# Patient Record
Sex: Female | Born: 1955 | Race: Black or African American | Hispanic: No | Marital: Married | Smoking: Current every day smoker
Health system: Southern US, Community
[De-identification: ages and names within clinical notes are randomized; demographics above are authoritative.]

## PROBLEM LIST (undated history)

## (undated) DIAGNOSIS — I7122 Aneurysm of the aortic arch, without rupture: Secondary | ICD-10-CM

## (undated) DIAGNOSIS — I10 Essential (primary) hypertension: Secondary | ICD-10-CM

## (undated) DIAGNOSIS — I639 Cerebral infarction, unspecified: Secondary | ICD-10-CM

## (undated) HISTORY — PX: ABDOMINAL HYSTERECTOMY: SHX81

---

## 2015-09-13 ENCOUNTER — Emergency Department
Admission: EM | Admit: 2015-09-13 | Discharge: 2015-09-13 | Disposition: A | Discharge status: Home / Self Care | Payer: Worker's Compensation | Attending: Student | Admitting: Student

## 2015-09-13 ENCOUNTER — Encounter: Payer: Self-pay | Admitting: Emergency Medicine

## 2015-09-13 ENCOUNTER — Emergency Department: Payer: Worker's Compensation

## 2015-09-13 DIAGNOSIS — Y990 Civilian activity done for income or pay: Secondary | ICD-10-CM | POA: Insufficient documentation

## 2015-09-13 DIAGNOSIS — S83412A Sprain of medial collateral ligament of left knee, initial encounter: Secondary | ICD-10-CM

## 2015-09-13 DIAGNOSIS — Y9289 Other specified places as the place of occurrence of the external cause: Secondary | ICD-10-CM | POA: Insufficient documentation

## 2015-09-13 DIAGNOSIS — I10 Essential (primary) hypertension: Secondary | ICD-10-CM | POA: Insufficient documentation

## 2015-09-13 DIAGNOSIS — W1839XA Other fall on same level, initial encounter: Secondary | ICD-10-CM | POA: Insufficient documentation

## 2015-09-13 DIAGNOSIS — Y9389 Activity, other specified: Secondary | ICD-10-CM | POA: Insufficient documentation

## 2015-09-13 DIAGNOSIS — S86912A Strain of unspecified muscle(s) and tendon(s) at lower leg level, left leg, initial encounter: Secondary | ICD-10-CM

## 2015-09-13 HISTORY — DX: Essential (primary) hypertension: I10

## 2015-09-13 MED ORDER — NAPROXEN 500 MG PO TBEC: 500.0000 mg | DELAYED_RELEASE_TABLET | Freq: Two times a day (BID) | ORAL | Status: DC

## 2015-09-13 MED ORDER — HYDROCODONE-ACETAMINOPHEN 5-325 MG PO TABS: 1.0000 | ORAL_TABLET | Freq: Three times a day (TID) | ORAL | Status: DC | PRN

## 2015-09-13 MED ORDER — HYDROCODONE-ACETAMINOPHEN 5-325 MG PO TABS
1.0000 | ORAL_TABLET | Freq: Once | ORAL | Status: AC
Start: 2015-09-13 — End: 2015-09-13
  Administered 2015-09-13: 1 via ORAL
  Filled 2015-09-13: qty 1

## 2015-09-13 NOTE — ED Notes (Signed)
No WC profile on file, pt's employer called, Briana Wright. No answer, will try to call again. WC UDS not done at this time.

## 2015-09-13 NOTE — Discharge Instructions (Signed)
Knee Bracing  Knee braces are supports to help stabilize and protect an injured or painful knee. They come in many different styles. They should support and protect the knee without increasing the chance of other injuries to yourself or others. It is important not to have a false sense of security when using a brace. Knee braces that help you to keep using your knee:  · Do not restore normal knee stability under high stress forces.  · May decrease some aspects of athletic performance.  Some of the different types of knee braces are:  · Prophylactic knee braces are designed to prevent or reduce the severity of knee injuries during sports that make injury to the knee more likely.  · Rehabilitative knee braces are designed to allow protected motion of:  ¨ Injured knees.  ¨ Knees that have been treated with or without surgery.  There is no evidence that the use of a supportive knee brace protects the graft following a successful anterior cruciate ligament (ACL) reconstruction. However, braces are sometimes used to:   · Protect injured ligaments.  · Control knee movement during the initial healing period.  They may be used as part of the treatment program for the various injured ligaments or cartilage of the knee including the:  · Anterior cruciate ligament.  · Medial collateral ligament.  · Medial or lateral cartilage (meniscus).  · Posterior cruciate ligament.  · Lateral collateral ligament.  Rehabilitative knee braces are most commonly used:  · During crutch-assisted walking right after injury.  · During crutch-assisted walking right after surgery to repair the cartilage and/or cruciate ligament injury.  · For a short period of time, 2-8 weeks, after the injury or surgery.  The value of a rehabilitative brace as opposed to a cast or splint includes the:  · Ability to adjust the brace for swelling.  · Ability to remove the brace for examinations, icing, or showering.  · Ability to allow for movement in a controlled  range of motion.  Functional knee braces give support to knees that have already been injured. They are designed to provide stability for the injured knee and provide protection after repair. Functional knee braces may not affect performance much. Lower extremity muscle strengthening, flexibility, and improvement in technique are more important than bracing in treating ligamentous knee injuries. Functional braces are not a substitute for rehabilitation or surgical procedures.  Unloader/off-loader braces are designed to provide pain relief in arthritic knees. Patients with wear and tear arthritis from growing old or from an old cartilage injury (osteoarthritis) of the knee, and bowlegged (varus) or knock-knee (valgus) deformities, often develop increased pain in the arthritic side due to increased loading. Unloader/off-loader braces are made to reduce uneven loading in such knees. There is reduction in bowing out movement in bowlegged knees when the correct unloader brace is used. Patients with advanced osteoarthritis or severe varus or valgus alignment problems would not likely benefit from bracing.  Patellofemoral braces help the kneecap to move smoothly and well centered over the end of the femur in the knee.   Most people who wear knee braces feel that they help. However, there is a lack of scientific evidence that knee braces are helpful at the level needed for athletic participation to prevent injury. In spite of this, athletes report an increase in knee stability, pain relief, performance improvement, and confidence during athletics when using a brace.   Different knee problems require different knee braces:  · Your caregiver may suggest one   also need one for pain in the front of your knee that is not getting better with strengthening and  flexibility exercises. Get your caregiver's advice if you want to try a knee brace. The caregiver will advise you on where to get them and provide a prescription when it is needed to fashion and/or fit the brace. Knee braces are the least important part of preventing knee injuries or getting better following injury. Stretching, strengthening and technique improvement are far more important in caring for and preventing knee injuries. When strengthening your knee, increase your activities a little at a time so as not to develop injuries from overuse. Work out an exercise plan with your caregiver and/or physical therapist to get the best program for you. Do not let a knee brace become a crutch. Always remember, there are no braces which support the knee as well as your original ligaments and cartilage you were born with. Conditioning, proper warm-up, and stretching remain the most important parts of keeping your knees healthy. HOW TO USE A KNEE BRACE  During sports, knee braces should be used as directed by your caregiver.  Make sure that the hinges are where the knee bends.  Straps, tapes, or hook-and-loop tapes should be fastened around your leg as instructed.  You should check the placement of the brace during activities to make sure that it has not moved. Poorly positioned braces can hurt rather than help you.  To work well, a knee brace should be worn during all activities that put you at risk of knee injury.  Warm up properly before beginning athletic activities. HOME CARE INSTRUCTIONS  Knee braces often get damaged during normal use. Replace worn-out braces for maximum benefit.  Clean regularly with soap and water.  Inspect your brace often for wear and tear.  Cover exposed metal to protect others from injury.  Durable materials may cost more, but last longer. SEEK IMMEDIATE MEDICAL CARE IF:   Your knee seems to be getting worse rather than better.  You have increasing pain or  swelling in the knee.  You have problems caused by the knee brace.  You have increased swelling or inflammation (redness or soreness) in your knee.  Your knee becomes warm and more painful and you develop an unexplained temperature over 101F (38.3C). MAKE SURE YOU:   Understand these instructions.  Will watch your condition.  Will get help right away if you are not doing well or get worse. See your caregiver, physical therapist, or orthopedic surgeon for additional information. Document Released: 03/01/2004 Document Revised: 04/26/2014 Document Reviewed: 06/08/2009 [facility] Patient Information 2015 [place], [place]. This information is not intended to replace advice given to you by your health care provider. Make sure you discuss any questions you have with your health care provider.  Knee Sprain A knee sprain is a tear in one of the strong, fibrous tissues that connect the bones (ligaments) in your knee. The severity of the sprain depends on how much of the ligament is torn. The tear can be either partial or complete. CAUSES  Often, sprains are a result of a fall or injury. The force of the impact causes the fibers of your ligament to stretch too much. This excess tension causes the fibers of your ligament to tear. SIGNS AND SYMPTOMS  You may have some loss of motion in your knee. Other symptoms include:  Bruising.  Pain in the knee area.  Tenderness of the knee to the touch.  Swelling. DIAGNOSIS  To diagnose  a knee sprain, your health care provider will physically examine your knee. Your health care provider may also suggest an X-ray exam of your knee to make sure no bones are broken. TREATMENT  If your ligament is only partially torn, treatment usually involves keeping the knee in a fixed position (immobilization) or bracing your knee for activities that require movement for several weeks. To do this, your health care provider will apply a bandage, cast, or splint to keep your  knee from moving and to support your knee during movement until it heals. For a partially torn ligament, the healing process usually takes 4-6 weeks. If your ligament is completely torn, depending on which ligament it is, you may need surgery to reconnect the ligament to the bone or reconstruct it. After surgery, a cast or splint may be applied and will need to stay on your knee for 4-6 weeks while your ligament heals. HOME CARE INSTRUCTIONS  Keep your injured knee elevated to decrease swelling.  To ease pain and swelling, apply ice to the injured area:  Put ice in a plastic bag.  Place a towel between your skin and the bag.  Leave the ice on for 20 minutes, 2-3 times a day.  Only take medicine for pain as directed by your health care provider.  Do not leave your knee unprotected until pain and stiffness go away (usually 4-6 weeks).  If you have a cast or splint, do not allow it to get wet. If you have been instructed not to remove it, cover it with a plastic bag when you shower or bathe. Do not swim.  Your health care provider may suggest exercises for you to do during your recovery to prevent or limit permanent weakness and stiffness. SEEK IMMEDIATE MEDICAL CARE IF:  Your cast or splint becomes damaged.  Your pain becomes worse.  You have significant pain, swelling, or numbness below the cast or splint. MAKE SURE YOU:  Understand these instructions.  Will watch your condition.  Will get help right away if you are not doing well or get worse. Document Released: 12/10/2005 Document Revised: 09/30/2013 Document Reviewed: 07/22/2013 [facility] Patient Information 2015 [place], [place]. This information is not intended to replace advice given to you by your health care provider. Make sure you discuss any questions you have with your health care provider.  Your exam and x-ray indicate a strain to the knee.  You should wear the splint, and use the crutches to walk.  You should follow-up  with Dr. Hyacinth Meeker or your company's ortho provider for further management. Apply ice to reduce symptoms. Take the prescription meds as directed.

## 2015-09-13 NOTE — ED Provider Notes (Signed)
[facility] Emergency Department Provider Note ____________________________________________  Time seen: 1500  I have reviewed the triage vital signs and the nursing notes.  HISTORY  Chief Complaint  Knee Pain  HPI Briana Wright is a 59 y.o. female patient to the ED for evaluation of injury to her left knee while at work. 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 She noted immediate pain to the knee and swelling as well. She is here for evaluation of her pain and disability. She rates her pain at a 9/10 in triage.  Past Medical History  Diagnosis Date  . Hypertension    There are no active problems to display for this patient.   Past Surgical History  Procedure Laterality Date  . Abdominal hysterectomy     Current Outpatient Rx  Name  Route  Sig  Dispense  Refill  . HYDROcodone-acetaminophen (NORCO) 5-325 MG per tablet   Oral   Take 1 tablet by mouth every 8 (eight) hours as needed for moderate pain.   12 tablet   0   . naproxen (EC NAPROSYN) 500 MG EC tablet   Oral   Take 1 tablet (500 mg total) by mouth 2 (two) times daily with a meal.   30 tablet   0    Allergies Review of patient's allergies indicates no known allergies.  No family history on file.  Social History Social History  Substance Use Topics  . Smoking status: Never Smoker   . Smokeless tobacco: None  . Alcohol Use: No    Review of Systems  Constitutional: Negative for fever. Eyes: Negative for visual changes. ENT: Negative for sore throat. Cardiovascular: Negative for chest pain. Respiratory: Negative for shortness of breath. Gastrointestinal: Negative for abdominal pain, vomiting and diarrhea. Genitourinary: Negative for dysuria. Musculoskeletal: Negative for back pain. Left knee pain Skin: Negative for rash. Neurological: Negative  for headaches, focal weakness or numbness. ____________________________________________  PHYSICAL EXAM:  VITAL SIGNS: ED Triage Vitals  Enc Vitals Group     BP 09/13/15 1327 225/110 mmHg     Pulse Rate 09/13/15 1327 89     Resp 09/13/15 1327 22     Temp 09/13/15 1327 98 F (36.7 C)     Temp Source 09/13/15 1327 Oral     SpO2 09/13/15 1327 95 %     Weight 09/13/15 1327 160 lb (72.576 kg)     Height 09/13/15 1327  (1.651 m)     Head Cir --      Peak Flow --      Pain Score 09/13/15 1333 9     Pain Loc --      Pain Edu? --      Excl. in GC? --    Constitutional: Alert and oriented. Well appearing and in no distress. Eyes: Conjunctivae are normal. PERRL. Normal extraocular movements. ENT   Head: Normocephalic and atraumatic.   Nose: No congestion/rhinorrhea.   Mouth/Throat: Mucous membranes are moist.   Neck: Supple. No thyromegaly. Hematological/Lymphatic/Immunological: No cervical lymphadenopathy. Cardiovascular: Normal rate, regular rhythm.  Respiratory: Normal respiratory effort. No wheezes/rales/rhonchi. Gastrointestinal: Soft and nontender. No distention. Musculoskeletal: Left knee without obvious deformity, but mild joint effusion is appreciated predominately with medial joint swelling. She tender to palp over the medial aspect of the femoral condyle. Tender to palp over the medial collateral ligament. Nontender with normal range  of motion in all extremities.  Neurologic:  Normal gait without ataxia. Normal speech and language. No gross focal neurologic deficits are appreciated. Skin:  Skin is warm, dry and intact. No rash noted. Psychiatric: Mood and affect are normal. Patient exhibits appropriate insight and judgment. ____________________________________________   RADIOLOGY Left Knee IMPRESSION: Mild suprapatellar joint effusion. No fracture or dislocation is Noted.  I, Legacy Carrender, Charlesetta Ivory, personally viewed and evaluated these images (plain  radiographs) as part of my medical decision making.  ____________________________________________  PROCEDURES  Ace bandage Knee immobilizer Norco 5-325 mg PO ____________________________________________  INITIAL IMPRESSION / ASSESSMENT AND PLAN / ED COURSE  Left knee strain with mild effusion, consistent consistent with a MCL strain. We'll immobilize the knee and provide patient with appropriate orthopedics follow-up. She is to return to work with activities limited by splint and crutches. ____________________________________________  FINAL CLINICAL IMPRESSION(S) / ED DIAGNOSES  Final diagnoses:  Knee strain, left, initial encounter  Knee MCL sprain, left, initial encounter      Lissa Hoard, PA-C 09/13/15 1553  Gayla Doss, MD 09/13/15 1600

## 2015-09-13 NOTE — ED Notes (Signed)
Pt here from [employer], WC. Pt reports client fell on her, reports left knee pain.

## 2015-09-13 NOTE — ED Notes (Signed)
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  No deformity.

## 2015-10-07 ENCOUNTER — Observation Stay
Admission: EM | Admit: 2015-10-07 | Discharge: 2015-10-08 | Disposition: A | Discharge status: Home / Self Care | Payer: Self-pay | Attending: Internal Medicine | Admitting: Internal Medicine

## 2015-10-07 ENCOUNTER — Emergency Department: Payer: Self-pay

## 2015-10-07 ENCOUNTER — Other Ambulatory Visit: Payer: Self-pay

## 2015-10-07 DIAGNOSIS — I517 Cardiomegaly: Secondary | ICD-10-CM | POA: Insufficient documentation

## 2015-10-07 DIAGNOSIS — Z90710 Acquired absence of both cervix and uterus: Secondary | ICD-10-CM | POA: Insufficient documentation

## 2015-10-07 DIAGNOSIS — F17210 Nicotine dependence, cigarettes, uncomplicated: Secondary | ICD-10-CM | POA: Insufficient documentation

## 2015-10-07 DIAGNOSIS — Z8249 Family history of ischemic heart disease and other diseases of the circulatory system: Secondary | ICD-10-CM | POA: Insufficient documentation

## 2015-10-07 DIAGNOSIS — R0789 Other chest pain: Principal | ICD-10-CM | POA: Insufficient documentation

## 2015-10-07 DIAGNOSIS — R000 Tachycardia, unspecified: Secondary | ICD-10-CM | POA: Insufficient documentation

## 2015-10-07 DIAGNOSIS — R079 Chest pain, unspecified: Secondary | ICD-10-CM | POA: Diagnosis present

## 2015-10-07 DIAGNOSIS — I159 Secondary hypertension, unspecified: Secondary | ICD-10-CM | POA: Insufficient documentation

## 2015-10-07 DIAGNOSIS — J9811 Atelectasis: Secondary | ICD-10-CM | POA: Insufficient documentation

## 2015-10-07 DIAGNOSIS — R918 Other nonspecific abnormal finding of lung field: Secondary | ICD-10-CM | POA: Insufficient documentation

## 2015-10-07 DIAGNOSIS — F419 Anxiety disorder, unspecified: Secondary | ICD-10-CM | POA: Insufficient documentation

## 2015-10-07 DIAGNOSIS — I4581 Long QT syndrome: Secondary | ICD-10-CM | POA: Insufficient documentation

## 2015-10-07 DIAGNOSIS — R0602 Shortness of breath: Secondary | ICD-10-CM | POA: Insufficient documentation

## 2015-10-07 LAB — BASIC METABOLIC PANEL
ANION GAP: 10 (ref 5–15)
BUN: 29 mg/dL — AB (ref 6–20)
CALCIUM: 9.6 mg/dL (ref 8.9–10.3)
CO2: 24 mmol/L (ref 22–32)
CREATININE: 0.73 mg/dL (ref 0.44–1.00)
Chloride: 106 mmol/L (ref 101–111)
GFR calc Af Amer: >60 mL/min (ref >60)
GFR calc non Af Amer: >60 mL/min (ref >60)
GLUCOSE: 112 mg/dL — AB (ref 65–99)
Potassium: 3.8 mmol/L (ref 3.5–5.1)
Sodium: 140 mmol/L (ref 135–145)

## 2015-10-07 LAB — CBC
HCT: 43.6 % (ref 35.0–47.0)
HEMOGLOBIN: 14.3 g/dL (ref 12.0–16.0)
MCH: 27.6 pg (ref 26.0–34.0)
MCHC: 32.7 g/dL (ref 32.0–36.0)
MCV: 84.4 fL (ref 80.0–100.0)
Platelets: 300 10*3/uL (ref 150–440)
RBC: 5.16 MIL/uL (ref 3.80–5.20)
RDW: 14.5 % (ref 11.5–14.5)
WBC: 11.8 10*3/uL — ABNORMAL HIGH (ref 3.6–11.0)

## 2015-10-07 LAB — BRAIN NATRIURETIC PEPTIDE: B Natriuretic Peptide: 27 pg/mL (ref 0.0–100.0)

## 2015-10-07 LAB — FIBRIN DERIVATIVES D-DIMER (ARMC ONLY): Fibrin derivatives D-dimer (ARMC): 374.7 (ref 0–499)

## 2015-10-07 LAB — TROPONIN I: TROPONIN I: 0.03 ng/mL (ref <0.031)

## 2015-10-07 LAB — CK: CK TOTAL: 83 U/L (ref 38–234)

## 2015-10-07 MED ORDER — ATORVASTATIN CALCIUM 10 MG PO TABS
10.0000 mg | ORAL_TABLET | Freq: Every day | ORAL | Status: DC
  Administered 2015-10-07: 10 mg via ORAL
  Filled 2015-10-07: qty 1

## 2015-10-07 MED ORDER — ONDANSETRON HCL 4 MG/2ML IJ SOLN
4.0000 mg | Freq: Once | INTRAMUSCULAR | Status: AC
  Administered 2015-10-07: 4 mg via INTRAVENOUS

## 2015-10-07 MED ORDER — ACETAMINOPHEN 325 MG PO TABS
650.0000 mg | ORAL_TABLET | Freq: Four times a day (QID) | ORAL | Status: DC | PRN
  Administered 2015-10-07: 650 mg via ORAL
  Filled 2015-10-07: qty 2

## 2015-10-07 MED ORDER — DOCUSATE SODIUM 100 MG PO CAPS
100.0000 mg | ORAL_CAPSULE | Freq: Two times a day (BID) | ORAL | Status: DC
  Administered 2015-10-07: 100 mg via ORAL
  Filled 2015-10-07: qty 1

## 2015-10-07 MED ORDER — MORPHINE SULFATE (PF) 2 MG/ML IV SOLN: 1.0000 mg | INTRAVENOUS | Status: DC | PRN

## 2015-10-07 MED ORDER — MORPHINE SULFATE (PF) 4 MG/ML IV SOLN
INTRAVENOUS | Status: AC
  Administered 2015-10-07: 4 mg via INTRAMUSCULAR
  Filled 2015-10-07: qty 1

## 2015-10-07 MED ORDER — ENOXAPARIN SODIUM 40 MG/0.4ML ~~LOC~~ SOLN
40.0000 mg | SUBCUTANEOUS | Status: DC
  Filled 2015-10-07: qty 0.4

## 2015-10-07 MED ORDER — MORPHINE SULFATE (PF) 4 MG/ML IV SOLN
4.0000 mg | Freq: Once | INTRAVENOUS | Status: AC
  Administered 2015-10-07: 4 mg via INTRAVENOUS
  Filled 2015-10-07: qty 1

## 2015-10-07 MED ORDER — HYDRALAZINE HCL 20 MG/ML IJ SOLN
10.0000 mg | Freq: Once | INTRAMUSCULAR | Status: AC
  Administered 2015-10-07: 10 mg via INTRAVENOUS
  Filled 2015-10-07: qty 1

## 2015-10-07 MED ORDER — ACETAMINOPHEN 650 MG RE SUPP: 650.0000 mg | Freq: Four times a day (QID) | RECTAL | Status: DC | PRN

## 2015-10-07 MED ORDER — MORPHINE SULFATE (PF) 4 MG/ML IV SOLN
4.0000 mg | Freq: Once | INTRAVENOUS | Status: AC
  Administered 2015-10-07: 4 mg via INTRAMUSCULAR

## 2015-10-07 MED ORDER — SODIUM CHLORIDE 0.9 % IJ SOLN
3.0000 mL | Freq: Two times a day (BID) | INTRAMUSCULAR | Status: DC
  Administered 2015-10-07 (×2): 3 mL via INTRAVENOUS

## 2015-10-07 MED ORDER — HYDRALAZINE HCL 20 MG/ML IJ SOLN
10.0000 mg | Freq: Four times a day (QID) | INTRAMUSCULAR | Status: DC | PRN
  Administered 2015-10-07: 10 mg via INTRAVENOUS

## 2015-10-07 MED ORDER — HYDRALAZINE HCL 25 MG PO TABS
25.0000 mg | ORAL_TABLET | Freq: Three times a day (TID) | ORAL | Status: DC
  Administered 2015-10-07 – 2015-10-08 (×2): 25 mg via ORAL
  Filled 2015-10-07 (×3): qty 1

## 2015-10-07 MED ORDER — HYDRALAZINE HCL 20 MG/ML IJ SOLN
INTRAMUSCULAR | Status: AC
  Administered 2015-10-07: 10 mg via INTRAVENOUS
  Filled 2015-10-07: qty 1

## 2015-10-07 MED ORDER — METOPROLOL TARTRATE 25 MG PO TABS
25.0000 mg | ORAL_TABLET | Freq: Two times a day (BID) | ORAL | Status: DC
  Administered 2015-10-07 – 2015-10-08 (×3): 25 mg via ORAL
  Filled 2015-10-07 (×3): qty 1

## 2015-10-07 MED ORDER — ASPIRIN 81 MG PO CHEW
324.0000 mg | CHEWABLE_TABLET | Freq: Once | ORAL | Status: AC
  Administered 2015-10-07: 324 mg via ORAL
  Filled 2015-10-07: qty 4

## 2015-10-07 MED ORDER — ONDANSETRON 4 MG PO TBDP
4.0000 mg | ORAL_TABLET | Freq: Once | ORAL | Status: AC
  Administered 2015-10-07: 4 mg via ORAL

## 2015-10-07 MED ORDER — SENNOSIDES-DOCUSATE SODIUM 8.6-50 MG PO TABS
1.0000 | ORAL_TABLET | Freq: Every evening | ORAL | Status: DC | PRN
  Administered 2015-10-07: 1 via ORAL
  Filled 2015-10-07: qty 1

## 2015-10-07 MED ORDER — ONDANSETRON 4 MG PO TBDP
ORAL_TABLET | ORAL | Status: AC
  Administered 2015-10-07: 4 mg via ORAL
  Filled 2015-10-07: qty 1

## 2015-10-07 MED ORDER — ONDANSETRON HCL 4 MG/2ML IJ SOLN
INTRAMUSCULAR | Status: AC
  Administered 2015-10-07: 4 mg via INTRAVENOUS
  Filled 2015-10-07: qty 2

## 2015-10-07 MED ORDER — ASPIRIN EC 325 MG PO TBEC
325.0000 mg | DELAYED_RELEASE_TABLET | Freq: Every day | ORAL | Status: DC
  Administered 2015-10-08: 325 mg via ORAL
  Filled 2015-10-07: qty 1

## 2015-10-07 NOTE — ED Notes (Signed)
Pt was very frustrated when she got to the room because she has already been stuck for blood twice,   She reports that she is a difficult stick and she stated that she does not want to have to go through that again.   After talking and reasoning with pt, she allowed RN to attempt venipuncture with butterfly once.   She said that she did not want an IV unless she is sure she needs it.

## 2015-10-07 NOTE — Progress Notes (Signed)
Patient alert and oriented x4. Oriented to room, unit, and call bell. Admission completed. No complaints at this time. Will cont to assess. Skin assessment verified by Jessica Christmas, RN. Telemetry box verified. Briana Wright   

## 2015-10-07 NOTE — ED Notes (Signed)
Patient to ED for chest pain. States it woke her up last night. States for a "while now" I can't "walk outside my house to the mailbox and I don't get winded." Had a fall on 09/20 that she thinks may be contributing to her decline in health. Points to areas of "brusing" all over her body. No ecchymosis noted to stated body parts. Patient alert and oriented, able to answer questions appropriately.

## 2015-10-07 NOTE — ED Provider Notes (Signed)
[facility] Emergency Department Provider Note  ____________________________________________  Time seen: Approximately 5:12 AM  I have reviewed the triage vital signs and the nursing notes.   HISTORY  Chief Complaint Chest Pain    HPI Briana Wright is a 59 y.o. female who presents to the ED from home with a chief complaint of chest pain. Patient complains of intermittent left-sided chest pain 4 days. Describes aching, pressure type, nonradiating pain. Awoke with chest pain this morning. Symptoms associated with "cold sweats", nausea and shortness of breath. 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 Denies recent fever, chills, cough, congestion, abdominal pain, vomiting, diarrhea. No recent travel. Nothing makes the pain better or worse. Does note she has had increased dyspnea on exertion this week.   Past Medical History  Diagnosis Date  . Hypertension     There are no active problems to display for this patient.   Past Surgical History  Procedure Laterality Date  . Abdominal hysterectomy      Current Outpatient Rx  Name  Route  Sig  Dispense  Refill  . HYDROcodone-acetaminophen (NORCO) 5-325 MG per tablet   Oral   Take 1 tablet by mouth every 8 (eight) hours as needed for moderate pain.   12 tablet   0   . naproxen (EC NAPROSYN) 500 MG EC tablet   Oral   Take 1 tablet (500 mg total) by mouth 2 (two) times daily with a meal.   30 tablet   0     Allergies Review of patient's allergies indicates no known allergies.  Family history CAD   Social History Social History  Substance Use Topics  . Smoking status: Current Every Day Smoker  . Smokeless tobacco: None  . Alcohol Use: No    Review of Systems Constitutional: No fever/chills Eyes: No visual changes. ENT: No sore  throat. Cardiovascular: Positive for chest pain. Respiratory: Positive for shortness of breath. Gastrointestinal: No abdominal pain.  Positive for nausea, no vomiting.  No diarrhea.  No constipation. Genitourinary: Negative for dysuria. Musculoskeletal: Positive for left knee pain. Negative for back pain. Skin: Negative for rash. Neurological: Negative for headaches, focal weakness or numbness.  10-point ROS otherwise negative.  ____________________________________________   PHYSICAL EXAM:  VITAL SIGNS: ED Triage Vitals  Enc Vitals Group     BP --      Pulse --      Resp --      Temp --      Temp src --      SpO2 --      Weight --      Height --      Head Cir --      Peak Flow --      Pain Score 10/07/15 0410 7     Pain Loc --      Pain Edu? --      Excl. in GC? --     Constitutional: Alert and oriented. Well appearing and in mild acute distress. Appears angry and irritated. Eyes: Conjunctivae are normal. PERRL. EOMI. Head: Atraumatic. Nose: No congestion/rhinnorhea. Mouth/Throat: Mucous membranes are moist.  Oropharynx non-erythematous. Neck: No stridor.   Cardiovascular: Normal rate, regular rhythm. Grossly normal heart sounds.  Good peripheral circulation. No reproducible chest pain. Respiratory: Normal respiratory effort.  No retractions. Lungs CTAB. Gastrointestinal: Soft  and nontender. No distention. No abdominal bruits. No CVA tenderness. Musculoskeletal: Small bruise to the left posterior calf which is tender to palpation. Calf is supple without evidence for compartment syndrome. Neurologic:  Normal speech and language. No gross focal neurologic deficits are appreciated.  Skin:  Skin is warm, dry and intact. No rash noted. Psychiatric: Mood and affect are normal. Speech and behavior are normal.  ____________________________________________   LABS (all labs ordered are listed, but only abnormal results are displayed)  Labs Reviewed  BASIC METABOLIC PANEL   CBC  TROPONIN I  BRAIN NATRIURETIC PEPTIDE  FIBRIN DERIVATIVES D-DIMER ([facility] ONLY)   ____________________________________________  EKG  ED ECG REPORT I, SUNG,JADE J, the attending physician, personally viewed and interpreted this ECG.   Date: 10/07/2015  EKG Time: 0411  Rate: 114  Rhythm: sinus tachycardia  Axis: Normal  Intervals:none  ST&T Change: Inverted T waves inferior laterally No old EKG for comparison ____________________________________________  RADIOLOGY  Chest x-ray (viewed by me, interpreted per Dr. Andria Meuse): Linear scarring or atelectasis in the lung bases. No evidence of focal consolidation.  Doppler ultrasound left leg interpreted per Dr. Andria Meuse: No evidence of DVT. ____________________________________________   PROCEDURES  Procedure(s) performed: None  Critical Care performed: No  ____________________________________________   INITIAL IMPRESSION / ASSESSMENT AND PLAN / ED COURSE  Pertinent labs & imaging results that were available during my care of the patient were reviewed by me and considered in my medical decision making (see chart for details).  59 year old female who presents with left-sided chest pain. She is tachycardic on exam with left calf pain. No prior history of coronary artery disease or congestive heart failure. Patient is a difficult venous stick. Will administer IM morphine for comfort; nurse to place ultrasound-guided IV. Will obtain basic lab work including troponin, d-dimer; obtain Doppler ultrasound of left lower extremity to evaluate for DVT.  ----------------------------------------- 7:05 AM on 10/07/2015 -----------------------------------------  Patient resting in NAD. Blood pressure remains elevated. Will order IV dose of hydralazine. Awaiting laboratory results. Anticipate hospital admission for chest pain. Care transferred to Dr. Fanny Bien. ____________________________________________   FINAL CLINICAL IMPRESSION(S)  / ED DIAGNOSES  Final diagnoses:  Chest pain, unspecified chest pain type      Irean Hong, MD 10/07/15 [id number]

## 2015-10-07 NOTE — ED Notes (Signed)
Pt to xray

## 2015-10-07 NOTE — ED Provider Notes (Signed)
Patient reported a slight recurrence of her chest pain, every ordered morphine 4. Her first and labs are reassuring,    repeat EKG is performed 1121 Interpreted as normal sinus rhythm, ventricular rate 92 LVH Nonspecific T-wave abnormality, as compared with previous EKG from 4:15 AM there is less notable T-wave inversions. Cannot rule out ischemic changes, based upon her ongoing and recurrent chest pain will do the hospital for ongoing chest pain management. No evidence of acute ST elevation MI.    Sharyn CreamerMark Quale, MD 10/07/15 1124

## 2015-10-07 NOTE — H&P (Signed)
[facility] [place] at [facility]   PATIENT NAME: Briana Wright    MR#:  [id number]  DATE OF BIRTH:  06/21/1956  DATE OF ADMISSION:  10/07/2015  PRIMARY CARE PHYSICIAN: Corky DownsMASOUD,JAVED, MD   REQUESTING/REFERRING PHYSICIAN: Dr Fanny BienQuale  CHIEF COMPLAINT:  Chest pain HISTORY OF PRESENT ILLNESS:  Briana Wright  is a 59 y.o. female with a known history of essential hypertension who is not taking medications presents with above complaint. Patient reports since Monday she's had ongoing chest pain. She reports that her chest pain is worse with movement. It also occurs at rest. It has been constant since Monday however she did have some relief taking aspirin, Tylenol and IV Pitocin. She presented today because she continues to have this chest pain. Chest pain is substernally located predominantly. It does not radiate to her arms but occasionally has radiated to her left shoulder. She does report she has shortness of breath associated with her constant chest pain.  PAST MEDICAL HISTORY:   Past Medical History  Diagnosis Date  . Hypertension     PAST SURGICAL HISTORY:   Past Surgical History  Procedure Laterality Date  . Abdominal hysterectomy      SOCIAL HISTORY:   Social History  Substance Use Topics  . Smoking status: Current Every Day Smoker  . Smokeless tobacco: Not on file  . Alcohol Use: No    FAMILY HISTORY:  No family history on file.  DRUG ALLERGIES:  No Known Allergies   REVIEW OF SYSTEMS:  CONSTITUTIONAL: No fever, fatigue or weakness.  EYES: No blurred or double vision.  EARS, NOSE, AND THROAT: No tinnitus or ear pain.  RESPIRATORY: No cough, shortness of breath, wheezing or hemoptysis.  CARDIOVASCULAR:  positive chest pain, no orthopnea, edema.  GASTROINTESTINAL: No nausea, vomiting, diarrhea or abdominal pain.  GENITOURINARY: No dysuria, hematuria.  ENDOCRINE: No polyuria, nocturia,  HEMATOLOGY: No anemia, easy bruising or  bleeding SKIN: No rash or lesion. MUSCULOSKELETAL: No joint pain or arthritis.   NEUROLOGIC: No tingling, numbness, weakness.  PSYCHIATRY: No anxiety or depression.   MEDICATIONS AT HOME:   Prior to Admission medications   Medication Sig Start Date End Date Taking? Authorizing Provider  ibuprofen (ADVIL,MOTRIN) 200 MG tablet Take 400 mg by mouth every 6 (six) hours as needed.   Yes Historical Provider, MD      VITAL SIGNS:  Blood pressure 163/96, pulse 91, resp. rate 18, SpO2 97 %.  PHYSICAL EXAMINATION:  GENERAL:  59 y.o.-year-old patient lying in the bed with no acute distress.  EYES: Pupils equal, round, reactive to light and accommodation. No scleral icterus. Extraocular muscles intact.  HEENT: Head atraumatic, normocephalic. Oropharynx clear.  NECK:  Supple, no jugular venous distention. No thyroid enlargement, no tenderness.  LUNGS: Normal breath sounds bilaterally, no wheezing, rales,rhonchi or crepitation. No use of accessory muscles of respiration.  CARDIOVASCULAR: S1, S2 normal. No murmurs, rubs, or gallops.  ABDOMEN: Soft, nontender, nondistended. Bowel sounds present. No organomegaly or mass.  EXTREMITIES: No pedal edema, cyanosis, or clubbing.  NEUROLOGIC: Cranial nerves II through XII are grossly intact. No focal deficits. PSYCHIATRIC: The patient is alert and oriented x 3.  SKIN: No obvious rash, lesion, or ulcer.   LABORATORY PANEL:   CBC  Recent Labs Lab 10/07/15 0606  WBC 11.8*  HGB 14.3  HCT 43.6  PLT 300   ------------------------------------------------------------------------------------------------------------------  Chemistries   Recent Labs Lab 10/07/15 0606  NA 140  K 3.8  CL 106  CO2 24  GLUCOSE 112*  BUN 29*  CREATININE 0.73  CALCIUM 9.6   ------------------------------------------------------------------------------------------------------------------  Cardiac Enzymes  Recent Labs Lab 10/07/15 0606  TROPONINI <0.03    ------------------------------------------------------------------------------------------------------------------  RADIOLOGY:  Dg Chest 2 View  10/07/2015  CLINICAL DATA:  Chest pain since Monday. Patient gets winded walking to the female box. Bruising all over the body. Smoker. EXAM: CHEST  2 VIEW COMPARISON:  None. FINDINGS: Linear scarring or atelectasis in the lung bases. Peribronchial thickening suggesting chronic bronchitis. Normal heart size and pulmonary vascularity. No focal airspace disease or consolidation in the lungs. No blunting of costophrenic angles. No pneumothorax. Mediastinal contours appear intact. Calcified aorta. IMPRESSION: Linear scarring or atelectasis in the lung bases. No evidence of focal consolidation. Electronically Signed   By: Burman Nieves M.D.   On: 10/07/2015 04:48   US Venous Img Lower Unilateral Left  10/07/2015  CLINICAL DATA:  Calf pain for 1 month. Patient had a client fall on her. EXAM: Left LOWER EXTREMITY VENOUS DOPPLER ULTRASOUND TECHNIQUE: Gray-scale sonography with graded compression, as well as color Doppler and duplex ultrasound were performed to evaluate the lower extremity deep venous systems from the level of the common femoral vein and including the common femoral, femoral, profunda femoral, popliteal and calf veins including the posterior tibial, peroneal and gastrocnemius veins when visible. The superficial great saphenous vein was also interrogated. Spectral Doppler was utilized to evaluate flow at rest and with distal augmentation maneuvers in the common femoral, femoral and popliteal veins. COMPARISON:  None. FINDINGS: Contralateral Common Femoral Vein: Respiratory phasicity is normal and symmetric with the symptomatic side. No evidence of thrombus. Normal compressibility. Common Femoral Vein: No evidence of thrombus. Normal compressibility, respiratory phasicity and response to augmentation. Saphenofemoral Junction: No evidence of thrombus.  Normal compressibility and flow on color Doppler imaging. Profunda Femoral Vein: No evidence of thrombus. Normal compressibility and flow on color Doppler imaging. Femoral Vein: No evidence of thrombus. Normal compressibility, respiratory phasicity and response to augmentation. Popliteal Vein: No evidence of thrombus. Normal compressibility, respiratory phasicity and response to augmentation. Calf Veins: No evidence of thrombus. Normal compressibility and flow on color Doppler imaging. Superficial Great Saphenous Vein: No evidence of thrombus. Normal compressibility and flow on color Doppler imaging. Venous Reflux:  None. Other Findings:  None. IMPRESSION: No evidence of deep venous thrombosis. Electronically Signed   By: Burman Nieves M.D.   On: 10/07/2015 06:47    EKG:  Normal sinus rhythm EKG at 4 clock in the morning shows no ST elevations. EKG at 11:00 with some chest pain had some T-wave changes IMPRESSION AND PLAN:  59 year old female with history of hypertension not taking medications who presents with atypical chest pain.  1. Atypical chest pain: Patient reports this is been constant since Monday. She continues to have chest pain and had an abnormal second EKG, therefore warrants admission into the hospital for cardiac workup. First set of troponin is negative. I have ordered 2 more. If these are all negative patient will undergo cardiac stress test. I will also consult cardiology for further recommendations. Patient has been started on aspirin, metoprolol and statin.  2. Accelerated hypertension: Patient had not been taking medications. She says "I don't like taking pills". She does agree to start metoprolol. Hydralazine when necessary has also been ordered.  3. Tobacco dependence: Patient reports she smokes cigarettes only when she's anxious. Patient was counseled for 3 minutes to completely stop smoking and find another outlet for her anxiety.  All the records are reviewed  and case  discussed with ED provider. Management plans discussed with the patient and she is in agreement.  CODE STATUS: Full  TOTAL TIME TAKING CARE OF THIS PATIENT: 45 minutes.    Cate Oravec M.D on 10/07/2015 at 12:08 PM  Between 7am to 6pm - Pager - [phone] After 6pm go to [email or web address] - password [email or web address]  [place] [facility]  Office  [phone]  CC: Primary care physician; Corky Downs, MD

## 2015-10-07 NOTE — ED Notes (Signed)
Pt to ultrasound via wheelchair.

## 2015-10-07 NOTE — ED Notes (Signed)
Pt went to x-ray from triage,  She has not returned to the room yet

## 2015-10-08 ENCOUNTER — Observation Stay: Payer: Self-pay

## 2015-10-08 LAB — LIPID PANEL
Cholesterol: 258 mg/dL — ABNORMAL HIGH (ref 0–200)
HDL: 36 mg/dL — AB (ref >40)
LDL CALC: 197 mg/dL — AB (ref 0–99)
TRIGLYCERIDES: 127 mg/dL (ref <150)
Total CHOL/HDL Ratio: 7.2 RATIO
VLDL: 25 mg/dL (ref 0–40)

## 2015-10-08 LAB — GLUCOSE, CAPILLARY
GLUCOSE-CAPILLARY: 95 mg/dL (ref 65–99)
Glucose-Capillary: 101 mg/dL — ABNORMAL HIGH (ref 65–99)

## 2015-10-08 LAB — NM MYOCAR MULTI W/SPECT W/WALL MOTION / EF
LV dias vol: 56 mL
LVSYSVOL: 18 mL
SDS: 0
SRS: 3
SSS: 1

## 2015-10-08 LAB — CBC
HEMATOCRIT: 41.5 % (ref 35.0–47.0)
HEMOGLOBIN: 13.5 g/dL (ref 12.0–16.0)
MCH: 27.5 pg (ref 26.0–34.0)
MCHC: 32.6 g/dL (ref 32.0–36.0)
MCV: 84.4 fL (ref 80.0–100.0)
Platelets: 292 10*3/uL (ref 150–440)
RBC: 4.91 MIL/uL (ref 3.80–5.20)
RDW: 14.6 % — AB (ref 11.5–14.5)
WBC: 9.9 10*3/uL (ref 3.6–11.0)

## 2015-10-08 LAB — HEMOGLOBIN A1C: HEMOGLOBIN A1C: 5.6 % (ref 4.0–6.0)

## 2015-10-08 MED ORDER — TECHNETIUM TC 99M SESTAMIBI - CARDIOLITE
30.5000 | Freq: Once | INTRAVENOUS | Status: AC | PRN
  Administered 2015-10-08: 10:00:00 30.5 via INTRAVENOUS

## 2015-10-08 MED ORDER — NICOTINE 7 MG/24HR TD PT24: 7.0000 mg | MEDICATED_PATCH | Freq: Every day | TRANSDERMAL | Status: DC

## 2015-10-08 MED ORDER — ATORVASTATIN CALCIUM 10 MG PO TABS: 10.0000 mg | ORAL_TABLET | Freq: Every day | ORAL | Status: DC

## 2015-10-08 MED ORDER — METOPROLOL TARTRATE 50 MG PO TABS: 50.0000 mg | ORAL_TABLET | Freq: Two times a day (BID) | ORAL | Status: DC

## 2015-10-08 MED ORDER — TECHNETIUM TC 99M SESTAMIBI - CARDIOLITE
13.5000 | Freq: Once | INTRAVENOUS | Status: AC | PRN
  Administered 2015-10-08: 13.5 via INTRAVENOUS

## 2015-10-08 NOTE — Progress Notes (Signed)
Pt discharged to home, pt very irritable on return from stress test, IV site DCd, bleeding controlled, no CP, tele monitor turned in, pt refuses any intervention for constipation, states she has not eaten and will take care of it herself.  Low risk myoview.  Pt left hospital in car with her family

## 2015-10-08 NOTE — Discharge Summary (Signed)
[facility] [place] at [facility]   PATIENT NAME: Briana AmorMellisia Wright    MR#:  [id number]  DATE OF BIRTH:  [birth year]  DATE OF ADMISSION:  10/07/2015 ADMITTING PHYSICIAN: Adrian SaranSital Stacie Knutzen, MD  DATE OF DISCHARGE: 10/08/2015 PRIMARY CARE PHYSICIAN: MASOUD,JAVED, MD    ADMISSION DIAGNOSIS:  Secondary hypertension, unspecified [I15.9] Chest pain, unspecified chest pain type [R07.9]  DISCHARGE DIAGNOSIS:  Active Problems:   Chest pain   SECONDARY DIAGNOSIS:   Past Medical History  Diagnosis Date  . Hypertension     HOSPITAL COURSE:  59-year-old female with a history of hypertension who is not taking medications percent atypical chest pain.  1. Atypical chest pain: Patient underwent stress test to evaluate for acute coronary syndrome. Her troponins center negative and her stress test was low probability for ischemia. Patient was chest pain-free during her hospitalization. Her chest pain was atypical in nature in that its [place] since Monday and had tenderness to palpation.  2. Accelerated hypertension: Patient has not been taking medications and will be discharged on metoprolol.  3. Tobacco dependence: Patient scheduled for 3 minutes on admission. Patient will be discharged nicotine patch.   DISCHARGE CONDITIONS AND DIET:  Patient's being discharged home in stable condition on a heart healthy diet  CONSULTS OBTAINED:     DRUG ALLERGIES:  No Known Allergies  DISCHARGE MEDICATIONS:   Current Discharge Medication List    START taking these medications   Details  atorvastatin (LIPITOR) 10 MG tablet Take 1 tablet (10 mg total) by mouth daily at 6 PM. Qty: 30 tablet, Refills: 0    metoprolol tartrate (LOPRESSOR) 50 MG tablet Take 1 tablet (50 mg total) by mouth 2 (two) times daily. Qty: 60 tablet, Refills: 0      CONTINUE these medications which have NOT CHANGED   Details  ibuprofen (ADVIL,MOTRIN) 200 MG tablet Take 400 mg by mouth every 6 (six)  hours as needed.              Today   CHIEF COMPLAINT:  Patient is doing well this morning. Patient wants to go home. Patient denies chest pain.   VITAL SIGNS:  Blood pressure 145/85, pulse 76, temperature 98 F (36.7 C), temperature source Oral, resp. rate 18, height 5\' 5"  (1.651 m), weight 72.031 kg (158 lb 12.8 oz), SpO2 100 %.   REVIEW OF SYSTEMS:  Review of Systems  Constitutional: Negative for fever, chills and malaise/fatigue.  HENT: Negative for sore throat.   Eyes: Negative for blurred vision.  Respiratory: Negative for cough, hemoptysis, shortness of breath and wheezing.   Cardiovascular: Negative for chest pain, palpitations and leg swelling.  Gastrointestinal: Negative for nausea, vomiting, abdominal pain, diarrhea and blood in stool.  Genitourinary: Negative for dysuria.  Musculoskeletal: Negative for back pain.  Neurological: Negative for dizziness, tremors and headaches.  Endo/Heme/Allergies: Does not bruise/bleed easily.     PHYSICAL EXAMINATION:  GENERAL:  59 y.o.-year-old patient lying in the bed with no acute distress.  NECK:  Supple, no jugular venous distention. No thyroid enlargement, no tenderness.  LUNGS: Normal breath sounds bilaterally, no wheezing, rales,rhonchi  No use of accessory muscles of respiration.  CARDIOVASCULAR: S1, S2 normal. No murmurs, rubs, or gallops.  ABDOMEN: Soft, non-tender, non-distended. Bowel sounds present. No organomegaly or mass.  EXTREMITIES: No pedal edema, cyanosis, or clubbing.  PSYCHIATRIC: The patient is alert and oriented x 3.  SKIN: No obvious rash, lesion, or ulcer.   DATA REVIEW:   CBC  Recent Labs  Lab 10/08/15 0358  WBC 9.9  HGB 13.5  HCT 41.5  PLT 292    Chemistries   Recent Labs Lab 10/07/15 0606  NA 140  K 3.8  CL 106  CO2 24  GLUCOSE 112*  BUN 29*  CREATININE 0.73  CALCIUM 9.6    Cardiac Enzymes  Recent Labs Lab 10/07/15 0606 10/07/15 1456  TROPONINI <0.03 0.03     Microbiology Results  @  RADIOLOGY:  Dg Chest 2 View  10/07/2015  CLINICAL DATA:  Chest pain since Monday. Patient gets winded walking to the female box. Bruising all over the body. Smoker. EXAM: CHEST  2 VIEW COMPARISON:  None. FINDINGS: Linear scarring or atelectasis in the lung bases. Peribronchial thickening suggesting chronic bronchitis. Normal heart size and pulmonary vascularity. No focal airspace disease or consolidation in the lungs. No blunting of costophrenic angles. No pneumothorax. Mediastinal contours appear intact. Calcified aorta. IMPRESSION: Linear scarring or atelectasis in the lung bases. No evidence of focal consolidation. Electronically Signed   By: Burman Nieves M.D.   On: 10/07/2015 04:48   Nm Myocar Multi W/spect W/wall Motion / Ef  10/08/2015   The study is normal.  This is a low risk study.  The left ventricular ejection fraction is normal (55-65%).    US Venous Img Lower Unilateral Left  10/07/2015  CLINICAL DATA:  Calf pain for 1 month. Patient had a client fall on her. EXAM: Left LOWER EXTREMITY VENOUS DOPPLER ULTRASOUND TECHNIQUE: Gray-scale sonography with graded compression, as well as color Doppler and duplex ultrasound were performed to evaluate the lower extremity deep venous systems from the level of the common femoral vein and including the common femoral, femoral, profunda femoral, popliteal and calf veins including the posterior tibial, peroneal and gastrocnemius veins when visible. The superficial great saphenous vein was also interrogated. Spectral Doppler was utilized to evaluate flow at rest and with distal augmentation maneuvers in the common femoral, femoral and popliteal veins. COMPARISON:  None. FINDINGS: Contralateral Common Femoral Vein: Respiratory phasicity is normal and symmetric with the symptomatic side. No evidence of thrombus. Normal compressibility. Common Femoral Vein: No evidence of thrombus. Normal compressibility,  respiratory phasicity and response to augmentation. Saphenofemoral Junction: No evidence of thrombus. Normal compressibility and flow on color Doppler imaging. Profunda Femoral Vein: No evidence of thrombus. Normal compressibility and flow on color Doppler imaging. Femoral Vein: No evidence of thrombus. Normal compressibility, respiratory phasicity and response to augmentation. Popliteal Vein: No evidence of thrombus. Normal compressibility, respiratory phasicity and response to augmentation. Calf Veins: No evidence of thrombus. Normal compressibility and flow on color Doppler imaging. Superficial Great Saphenous Vein: No evidence of thrombus. Normal compressibility and flow on color Doppler imaging. Venous Reflux:  None. Other Findings:  None. IMPRESSION: No evidence of deep venous thrombosis. Electronically Signed   By: Burman Nieves M.D.   On: 10/07/2015 06:47      Management plans discussed with the patient and she is in agreement. Stable for discharge home  Patient should follow up with PCP CODE STATUS:     Code Status Orders        Start     Ordered   10/07/15 1430  Full code   Continuous     10/07/15 1429      TOTAL TIME TAKING CARE OF THIS PATIENT: 35 minutes.    Zyanya Glaza M.D on 10/08/2015 at 12:17 PM  Between 7am to 6pm - Pager - [phone] After 6pm go to [email or web address] - password [email or web address]  [facility]  Office  [phone]  CC: Primary care physician; Cletis Athens, MD

## 2017-06-21 IMAGING — CR DG CHEST 2V
1 series · 2 of 2 positions shown · non-contrast
Comparison: None.

CLINICAL DATA: Chest pain since [REDACTED]. Patient gets winded walking
to the male box. Bruising all over the body. Smoker.

EXAM:
CHEST  2 VIEW

[Series 1: dg chest 2 view · 0.14mm/px · 2 of 2 slices shown]
[im 1/2]
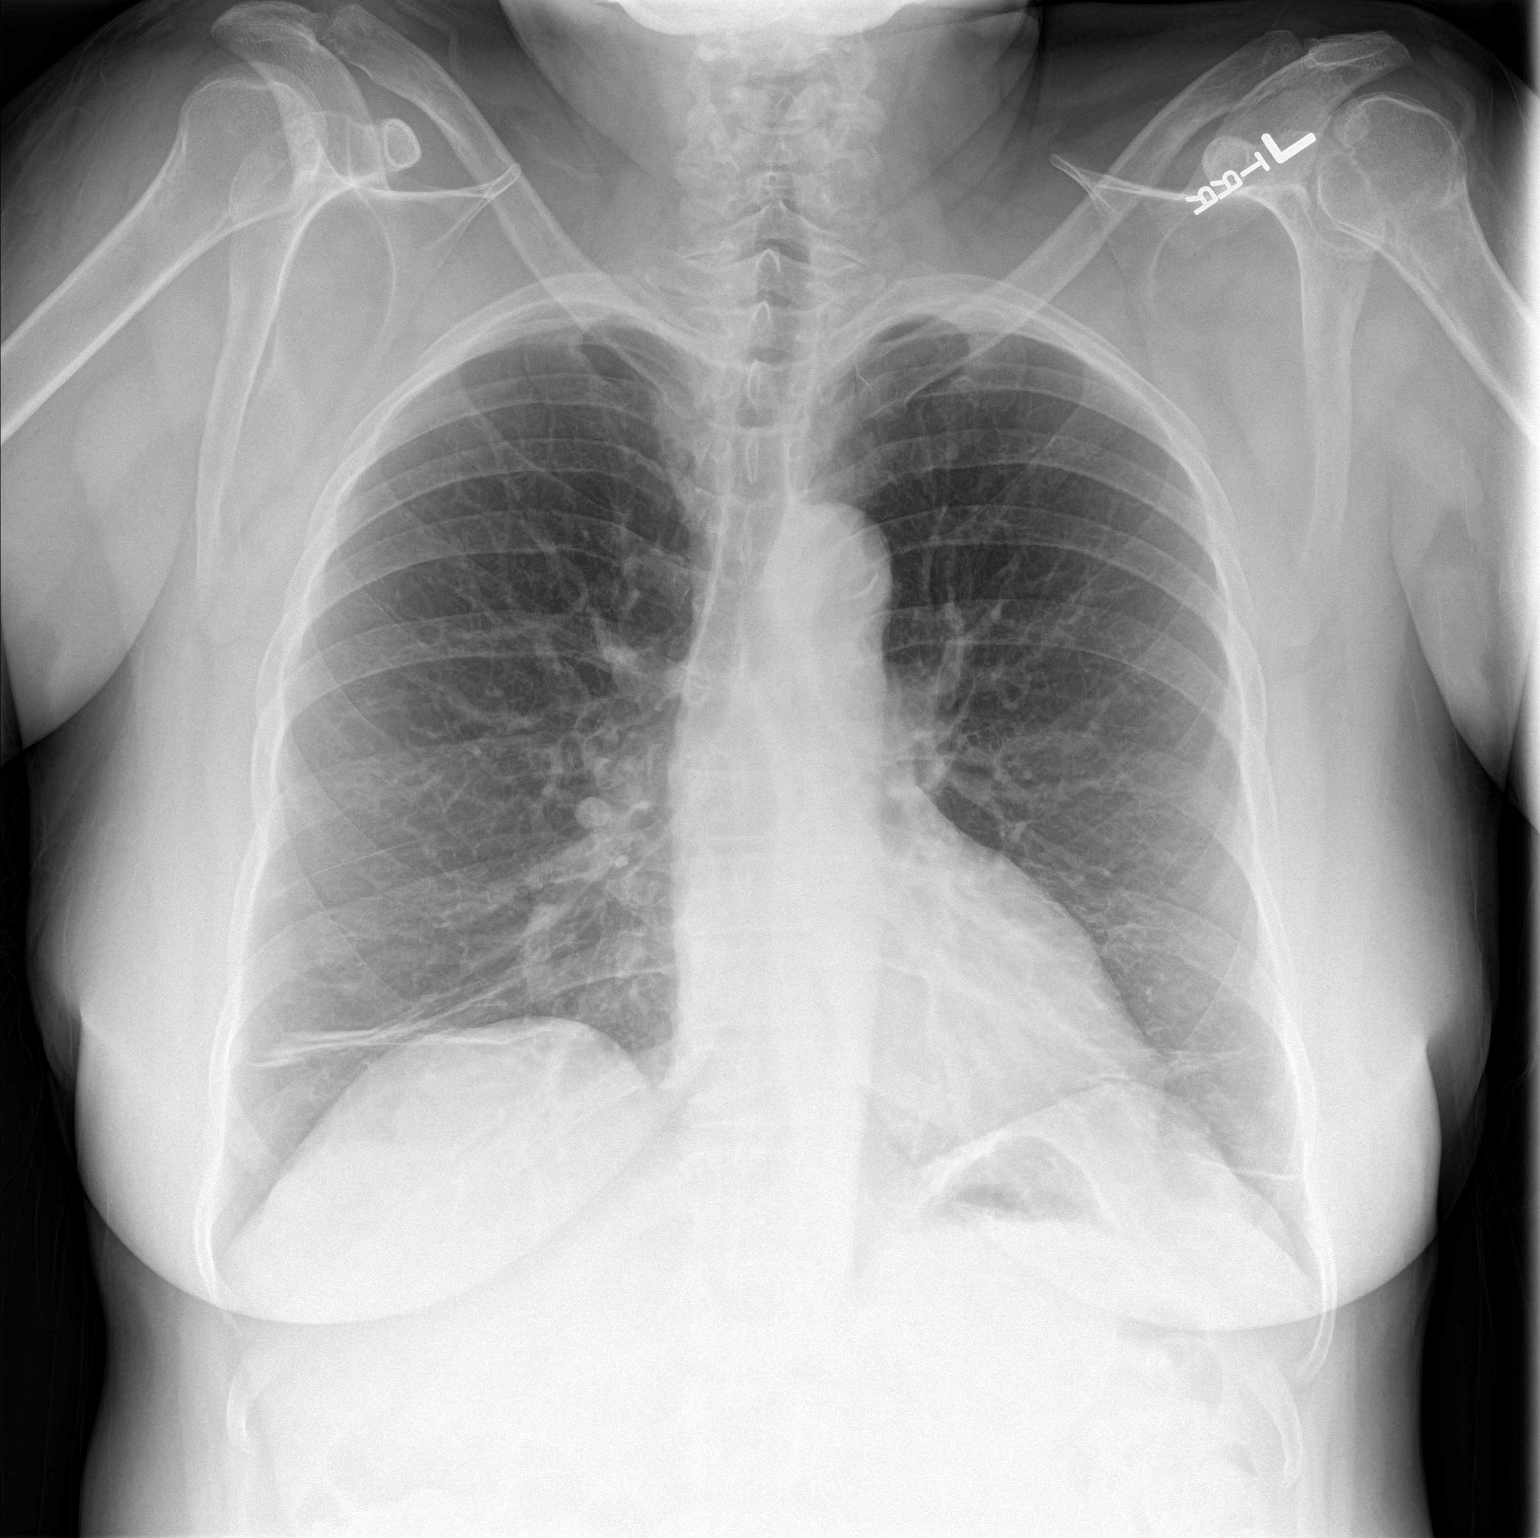
[im 2/2]
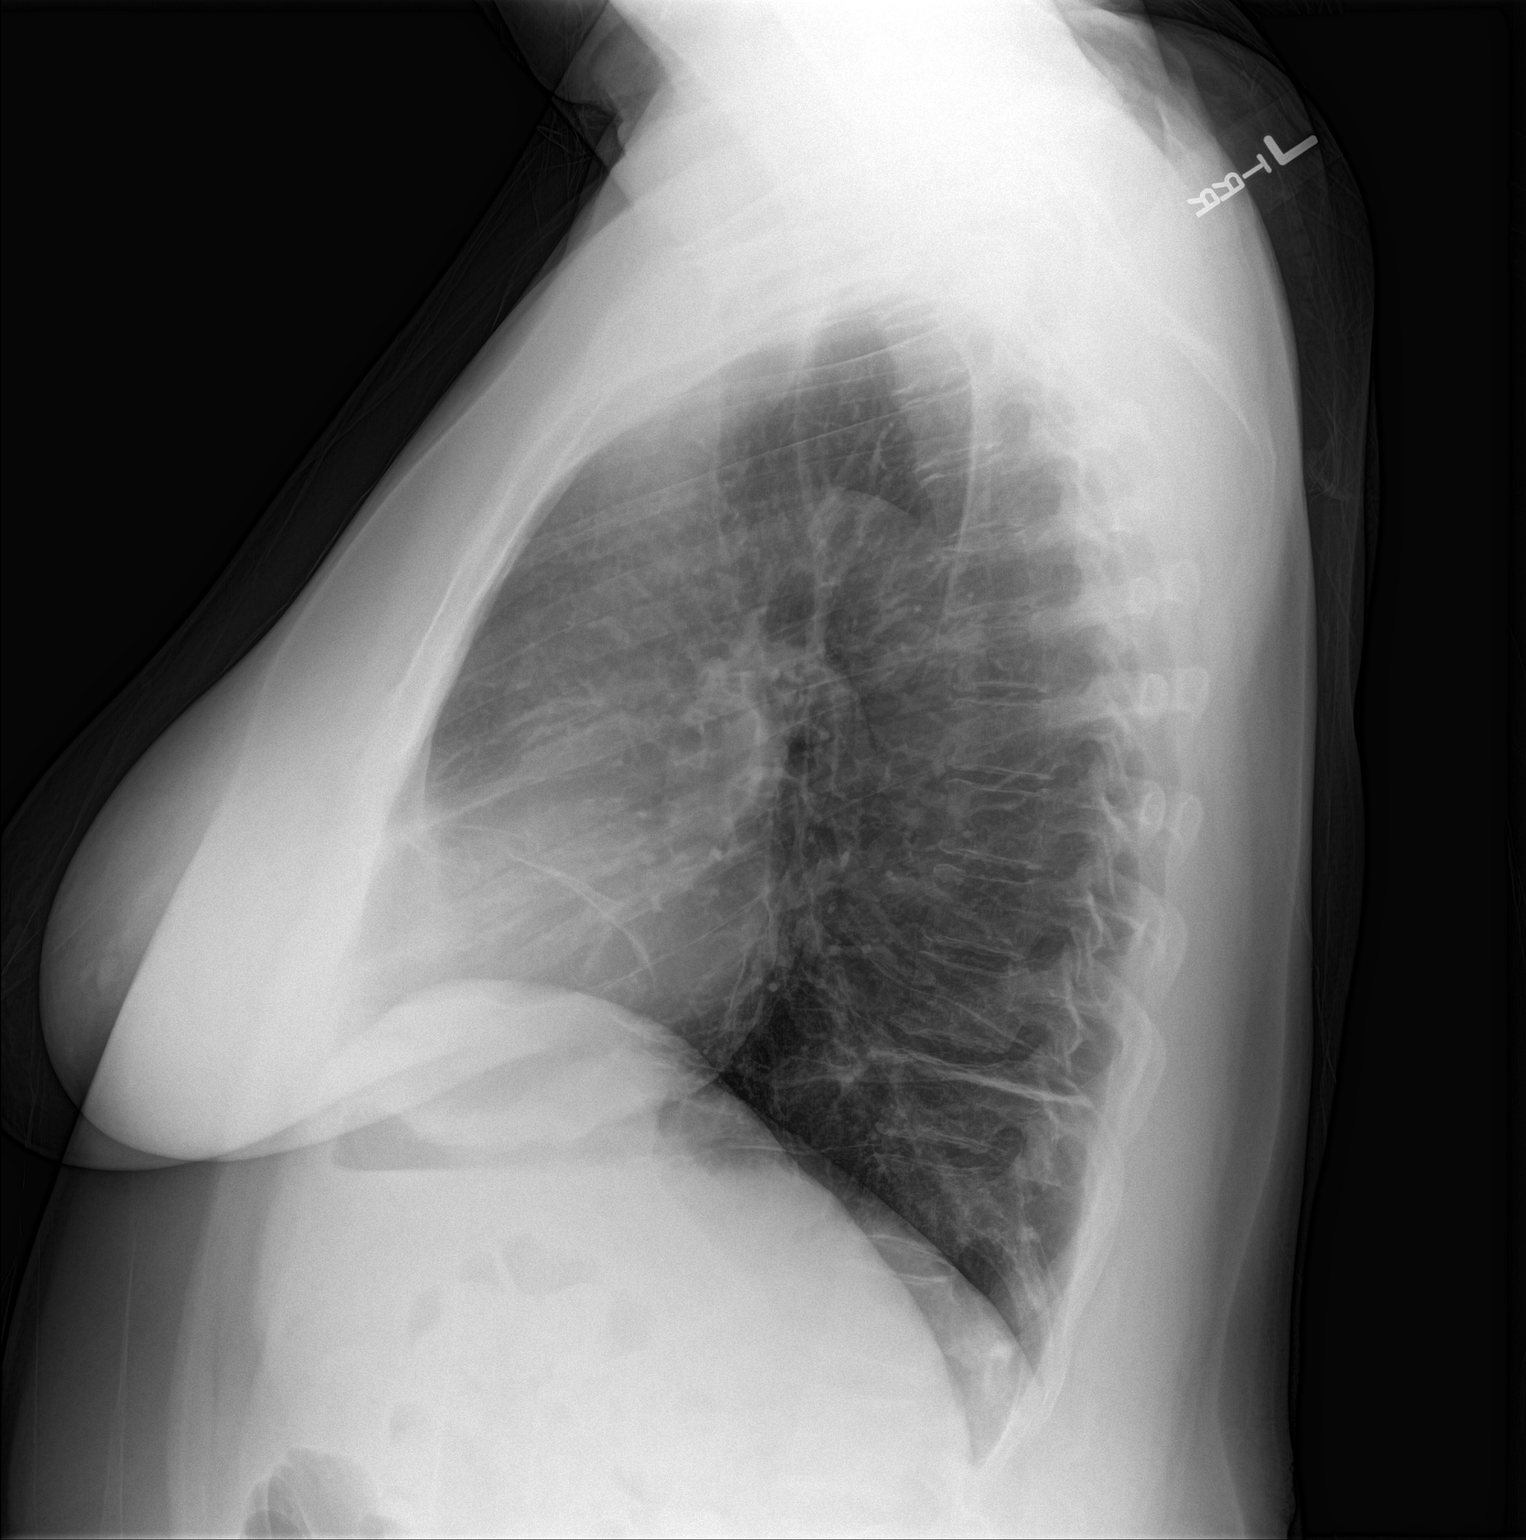

[2 of 2 positions shown; findings below may reference images not displayed]

FINDINGS: Linear scarring or atelectasis in the lung bases. Peribronchial
thickening suggesting chronic bronchitis. Normal heart size and
pulmonary vascularity. No focal airspace disease or consolidation in
the lungs. No blunting of costophrenic angles. No pneumothorax.
Mediastinal contours appear intact. Calcified aorta.
IMPRESSION: Linear scarring or atelectasis in the lung bases. No evidence of
focal consolidation.

## 2022-12-24 DIAGNOSIS — I720 Aneurysm of carotid artery: Secondary | ICD-10-CM

## 2022-12-24 HISTORY — DX: Aneurysm of carotid artery: I72.0

## 2023-05-09 ENCOUNTER — Inpatient Hospital Stay: Payer: Medicare Other

## 2023-05-09 ENCOUNTER — Inpatient Hospital Stay
Admission: EM | Admit: 2023-05-09 | Discharge: 2023-05-13 | DRG: 683 | Disposition: A | Discharge status: Skilled Nursing Facility | Payer: Medicare Other | Attending: Osteopathic Medicine | Admitting: Osteopathic Medicine

## 2023-05-09 ENCOUNTER — Encounter: Payer: Self-pay | Admitting: Emergency Medicine

## 2023-05-09 ENCOUNTER — Other Ambulatory Visit: Payer: Self-pay

## 2023-05-09 DIAGNOSIS — E871 Hypo-osmolality and hyponatremia: Secondary | ICD-10-CM | POA: Diagnosis present

## 2023-05-09 DIAGNOSIS — E875 Hyperkalemia: Secondary | ICD-10-CM | POA: Diagnosis present

## 2023-05-09 DIAGNOSIS — I10 Essential (primary) hypertension: Secondary | ICD-10-CM | POA: Diagnosis present

## 2023-05-09 DIAGNOSIS — N179 Acute kidney failure, unspecified: Secondary | ICD-10-CM | POA: Diagnosis present

## 2023-05-09 DIAGNOSIS — R531 Weakness: Secondary | ICD-10-CM

## 2023-05-09 DIAGNOSIS — I69351 Hemiplegia and hemiparesis following cerebral infarction affecting right dominant side: Secondary | ICD-10-CM | POA: Diagnosis not present

## 2023-05-09 DIAGNOSIS — E785 Hyperlipidemia, unspecified: Secondary | ICD-10-CM | POA: Diagnosis present

## 2023-05-09 DIAGNOSIS — E860 Dehydration: Secondary | ICD-10-CM | POA: Diagnosis present

## 2023-05-09 DIAGNOSIS — Z79899 Other long term (current) drug therapy: Secondary | ICD-10-CM

## 2023-05-09 DIAGNOSIS — Z8673 Personal history of transient ischemic attack (TIA), and cerebral infarction without residual deficits: Secondary | ICD-10-CM

## 2023-05-09 DIAGNOSIS — R197 Diarrhea, unspecified: Secondary | ICD-10-CM | POA: Diagnosis present

## 2023-05-09 DIAGNOSIS — D72829 Elevated white blood cell count, unspecified: Secondary | ICD-10-CM | POA: Diagnosis present

## 2023-05-09 HISTORY — DX: Cerebral infarction, unspecified: I63.9

## 2023-05-09 LAB — BASIC METABOLIC PANEL
Anion gap: 15 (ref 5–15)
BUN: 107 mg/dL — ABNORMAL HIGH (ref 8–23)
CO2: 15 mmol/L — ABNORMAL LOW (ref 22–32)
Calcium: 9.9 mg/dL (ref 8.9–10.3)
Chloride: 100 mmol/L (ref 98–111)
Creatinine, Ser: 2.04 mg/dL — ABNORMAL HIGH (ref 0.44–1.00)
GFR, Estimated: 26 mL/min — ABNORMAL LOW (ref >60)
Glucose, Bld: 97 mg/dL (ref 70–99)
Potassium: 5.2 mmol/L — ABNORMAL HIGH (ref 3.5–5.1)
Sodium: 130 mmol/L — ABNORMAL LOW (ref 135–145)

## 2023-05-09 LAB — CBC
HCT: 43.9 % (ref 36.0–46.0)
Hemoglobin: 13.8 g/dL (ref 12.0–15.0)
MCH: 26.5 pg (ref 26.0–34.0)
MCHC: 31.4 g/dL (ref 30.0–36.0)
MCV: 84.3 fL (ref 80.0–100.0)
Platelets: 431 10*3/uL — ABNORMAL HIGH (ref 150–400)
RBC: 5.21 MIL/uL — ABNORMAL HIGH (ref 3.87–5.11)
RDW: 15.2 % (ref 11.5–15.5)
WBC: 11.1 10*3/uL — ABNORMAL HIGH (ref 4.0–10.5)
nRBC: 0 % (ref 0.0–0.2)

## 2023-05-09 MED ORDER — ENOXAPARIN SODIUM 30 MG/0.3ML IJ SOSY
30.0000 mg | PREFILLED_SYRINGE | Freq: Every day | INTRAMUSCULAR | Status: DC
  Administered 2023-05-09: 30 mg via SUBCUTANEOUS
  Filled 2023-05-09: qty 0.3

## 2023-05-09 MED ORDER — SODIUM CHLORIDE 0.9 % IV SOLN: INTRAVENOUS | Status: DC

## 2023-05-09 MED ORDER — ONDANSETRON HCL 4 MG/2ML IJ SOLN
4.0000 mg | Freq: Four times a day (QID) | INTRAMUSCULAR | Status: DC | PRN
  Administered 2023-05-09 – 2023-05-10 (×2): 4 mg via INTRAVENOUS
  Filled 2023-05-09 (×2): qty 2

## 2023-05-09 MED ORDER — ATORVASTATIN CALCIUM 10 MG PO TABS
10.0000 mg | ORAL_TABLET | Freq: Every day | ORAL | Status: DC
  Administered 2023-05-10 – 2023-05-12 (×3): 10 mg via ORAL
  Filled 2023-05-09 (×3): qty 1

## 2023-05-09 MED ORDER — ACETAMINOPHEN 325 MG PO TABS
650.0000 mg | ORAL_TABLET | Freq: Four times a day (QID) | ORAL | Status: DC | PRN
  Administered 2023-05-10: 650 mg via ORAL
  Filled 2023-05-09: qty 2

## 2023-05-09 MED ORDER — LACTATED RINGERS IV BOLUS: 1000.0000 mL | Freq: Once | INTRAVENOUS | Status: DC

## 2023-05-09 MED ORDER — SODIUM CHLORIDE 0.9 % IV BOLUS
500.0000 mL | Freq: Once | INTRAVENOUS | Status: AC
  Administered 2023-05-09: 500 mL via INTRAVENOUS

## 2023-05-09 MED ORDER — SENNOSIDES-DOCUSATE SODIUM 8.6-50 MG PO TABS: 1.0000 | ORAL_TABLET | Freq: Every evening | ORAL | Status: DC | PRN

## 2023-05-09 MED ORDER — ACETAMINOPHEN 650 MG RE SUPP: 650.0000 mg | Freq: Four times a day (QID) | RECTAL | Status: DC | PRN

## 2023-05-09 MED ORDER — ONDANSETRON HCL 4 MG PO TABS: 4.0000 mg | ORAL_TABLET | Freq: Four times a day (QID) | ORAL | Status: DC | PRN

## 2023-05-09 NOTE — Assessment & Plan Note (Addendum)
Check portable chest x-ray, UA, procalcitonin

## 2023-05-09 NOTE — Assessment & Plan Note (Signed)
Patient on DAPT, aspirin and plavix

## 2023-05-09 NOTE — Assessment & Plan Note (Signed)
Atorvastatin 10 mg qhs resumed

## 2023-05-09 NOTE — ED Triage Notes (Signed)
Patient to ED via [facility] from home for generalized weakness. 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 Since has been getting more weak all over. Also having diarrhea for the past week.

## 2023-05-09 NOTE — Progress Notes (Signed)
PHARMACIST - PHYSICIAN COMMUNICATION  CONCERNING:  Enoxaparin (Lovenox) for DVT Prophylaxis    RECOMMENDATION: Patient was prescribed enoxaprin 40mg  q24 hours for VTE prophylaxis.   Filed Weights   05/09/23 2020  Weight: 72.6 kg (160 lb)    Body mass index is 28.35 kg/m.  Estimated Creatinine Clearance: 25.6 mL/min (A) (by C-G formula based on SCr of 2.04 mg/dL (H)).    Patient is candidate for enoxaparin 30mg  every 24 hours based on CrCl <16ml/min or Weight <45kg  DESCRIPTION: Pharmacy has adjusted enoxaparin dose per [facility] policy.  Patient is now receiving enoxaparin 30 mg every 24 hours    Sharen Hones, PharmD Clinical Pharmacist  05/09/2023 8:29 PM

## 2023-05-09 NOTE — H&P (Signed)
History and Physical   Briana Wright ZOX:[id number] DOB: 10-10-1956 DOA: 05/09/2023  PCP: Corky Downs, MD  Patient coming from: home via EMS  I have personally briefly reviewed patient's old medical records in [facility] EMR.  Chief Concern: weakness  HPI: [name] is a 66 year old female with prior history of left PCA stroke, on dual antiplatelet, hyperlipidemia, hypertension, who presents emergency department for chief concerns of weakness.  Vitals in the ED showed temperature 98.1, respiration of 18, heart rate of 55, blood pressure 109/68, SpO2 of 96 percent on room air.  Serum sodium is 130, potassium 5.2, chloride 100, bicarb 15, BUN 107, serum creatinine of 2.04, EGFR of 26, WBC 11.1, hemoglobin 13.8, platelets of 431.  ED treatment: LR 1 L bolus. ----------------------------- At bedside, patient was able to tell me her name, the current calendar year.  She will tell me her age, she states she is trying to get younger.  She knows she is in the hospital.  She states she does not want to be here.  She reports she is not getting better at home and she has no appetite and does not want to eat.  She states that she normally cares for her husband and since she has been sick no one has been able to care for her.  And she cannot care for herself.  I counseled her on appropriate nutrition, patient then rolls her eyes and looks away from me.  I ask if I can listen to heart and lungs, she states: "What for? "  When I responded that I wanted to listen to her heart to assess for murmurs and her abnormalities, including dysrhythmia.  She rolls her eyes at me.  I ask if I could see if she has swelling in her legs?  She states "why?"  She then states that she does not want me to be here and to go call her niece.  Social history: She lives at home with her husband.  She is a former tobacco user, quitting after the stroke.  She denies EtOH and recreational drug use.  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  ROS: Unable to fully complete as patient refuses to participate in the exam process  ED Course: Discussed with emergency medicine provider, patient requiring hospitalization for chief concerns of hyponatremia and acute kidney injury  Assessment/Plan  Principal Problem:   Hyponatremia Active Problems:   Hyperkalemia   AKI (acute kidney injury) (HCC)   History of left PCA stroke   Hyperlipemia   Diarrhea   Leukocytosis   Weakness   Assessment and Plan:  * Hyponatremia Presumed secondary to GI loss and poor appetite Patient is status post LR 1 L bolus per EDP Sodium chloride 500 mL bolus one-time dose ordered Sodium chloride 100 mL/h, 1 day ordered  Weakness Generalized weakness along with persistent right-sided weakness post left PCA stroke in early May 2024 Patient had home PT and not progressing well  PT, OT Fall precautions  Leukocytosis Check portable chest x-ray, UA, procalcitonin  Diarrhea He says that this has resolved Check GI panel and C. difficile panel  Hyperlipemia Atorvastatin 10 mg qhs resumed  History of left PCA stroke Patient on DAPT, aspirin and plavix  AKI (acute kidney injury) (HCC) Presumed prerenal secondary to GI loss Baseline creatinine level is 1.0/eGFR 62  Hyperkalemia Resumed secondary to acute kidney injury  Patient has developed agitation and behavioral changes since stroke Would recommend ensure that needs and or daughter  are in the room while patient gets PT, OT  Chart reviewed.   DVT prophylaxis: Enoxaparin 40 mg subcutaneous every 24 hours Code Status: full code Diet: Heart healthy Family Communication: Santina Evans (daughter) and Earleen Reaper (niece) Disposition Plan: pending clinical course Consults called: PT, OT Admission status: Telemetry medical, inpatient  Past Medical History:  Diagnosis Date   Hypertension    Stroke [facility])    Past Surgical History:  Procedure  Laterality Date   ABDOMINAL HYSTERECTOMY     Social History:  reports that she has been smoking. She does not have any smokeless tobacco history on file. She reports that she does not drink alcohol and does not use drugs.  No Known Allergies Family History  Family history unknown: Yes   Family history: Family history reviewed and not pertinent  Prior to Admission medications   Medication Sig Start Date End Date Taking? Authorizing Provider  atorvastatin (LIPITOR) 10 MG tablet Take 1 tablet (10 mg total) by mouth daily at 6 PM. 10/08/15   Adrian Saran, MD  ibuprofen (ADVIL,MOTRIN) 200 MG tablet Take 400 mg by mouth every 6 (six) hours as needed.    [provider]  metoprolol tartrate (LOPRESSOR) 50 MG tablet Take 1 tablet (50 mg total) by mouth 2 (two) times daily. 10/08/15   Adrian Saran, MD  nicotine (NICODERM CQ) 7 mg/24hr patch Place 1 patch (7 mg total) onto the skin daily. 10/08/15   Adrian Saran, MD   Physical Exam: Vitals:   05/09/23 1524 05/09/23 1900  BP: 109/68   Pulse: (!) 55   Resp: 18   Temp: 98.1 F (36.7 C)   TempSrc: Oral   SpO2: 96%   Height:  5' 2.99" (1.6 m)   Constitutional: appears age-appropriate, frail, NAD, calm Eyes: PERRL, lids and conjunctivae normal ENMT: Mucous membranes are dry.  Unable to further assess oropharynx Neck: Trachea is midline Respiratory: Unable to complete as patient refuses to let me listen to her heart and lungs Cardiovascular: Unable to complete as patient refuses Abdomen: Morbidly obese abdomen  Musculoskeletal: Able to complete as patient refuses to participate Skin: Unable to complete Neurologic: Unable to complete Psychiatric: Alert and oriented x 3.  Agitated/aggressive mood.   EKG: independently reviewed, showing sinus bradycardia with rate of 54, QTc 402  Chest x-ray on Admission: I personally reviewed, no x-ray evidence of acute cardiopulmonary process  Labs on Admission: I have personally reviewed following  labs CBC: Recent Labs  Lab 05/09/23 1526  WBC 11.1*  HGB 13.8  HCT 43.9  MCV 84.3  PLT 431*   Basic Metabolic Panel: Recent Labs  Lab 05/09/23 1526  NA 130*  K 5.2*  CL 100  CO2 15*  GLUCOSE 97  BUN 107*  CREATININE 2.04*  CALCIUM 9.9   GFR: CrCl cannot be calculated (Unknown ideal weight.).  This document was prepared using Dragon Voice Recognition software and may include unintentional dictation errors.  Dr. Sedalia Muta [facility]  If 7PM-7AM, please contact overnight-coverage provider If 7AM-7PM, please contact day attending provider [email or web address]  05/09/2023, 7:27 PM

## 2023-05-09 NOTE — Assessment & Plan Note (Signed)
Resumed secondary to acute kidney injury

## 2023-05-09 NOTE — Assessment & Plan Note (Addendum)
Presumed prerenal secondary to GI loss Baseline creatinine level is 1.0/eGFR 62

## 2023-05-09 NOTE — Assessment & Plan Note (Addendum)
He says that this has resolved Check GI panel and C. difficile panel

## 2023-05-09 NOTE — ED Provider Notes (Signed)
[facility] Provider Note    Event Date/Time   First MD Initiated Contact with Patient 05/09/23 1711     (approximate)   History   Weakness   HPI  Briana Wright is a 67 y.o. female   Past medical history of hypertension, stroke, who presents to the emergency department with generalized weakness and diarrhea over the last several days.  She was discharged from the hospital for stroke approximately 1 week ago and since then has been having some watery diarrhea at home poor p.o. intake and not progressing with her rehab.  Denies fever chills respiratory infectious symptoms abdominal pain.  She has required significant assistance to even use the bathroom at home.  She had a small fall today dropping down to her knees denies head strike or loss of consciousness.  She has had no urinary symptoms.  She has ongoing deficits from her stroke including right vision loss, right-sided hand weakness per patient reports, coordination, memory loss/confusion but all these are unchanged since her discharge.  Independent Historian contributed to assessment above: Patient's niece who is at bedside corroborates information given above  External Medical Documents Reviewed: Internal medicine office visit dated 05/02/2023 for a poststroke admission follow-up visit at that time noted that she had a left PCA CVA and hypertensive emergency while hospitalized earlier this month at [facility] started on dual antiplatelet therapy and at that time was noted to be feeling generally weak overall and poor appetite poor p.o. intake and diarrhea.      Physical Exam   Triage Vital Signs: ED Triage Vitals [05/09/23 1524]  Enc Vitals Group     BP 109/68     Pulse Rate (!) 55     Resp 18     Temp 98.1 F (36.7 C)     Temp Source Oral     SpO2 96 %     Weight      Height      Head Circumference      Peak Flow      Pain Score 0     Pain Loc      Pain Edu?      Excl. in  GC?     Most recent vital signs: Vitals:   05/09/23 1524  BP: 109/68  Pulse: (!) 55  Resp: 18  Temp: 98.1 F (36.7 C)  SpO2: 96%    General: Awake, no distress.  CV:  Good peripheral perfusion.  Resp:  Normal effort.  Abd:  No distention.  Other:  Awake alert comfortable conversant with normal vital signs afebrile.  Lungs clear abdomen soft nontender.  No obvious focal neurologic deficits including motor or sensory exam or facial asymmetry    ED Results / Procedures / Treatments   Labs (all labs ordered are listed, but only abnormal results are displayed) Labs Reviewed  BASIC METABOLIC PANEL - Abnormal; Notable for the following components:      Result Value   Sodium 130 (*)    Potassium 5.2 (*)    CO2 15 (*)    BUN 107 (*)    Creatinine, Ser 2.04 (*)    GFR, Estimated 26 (*)    All other components within normal limits  CBC - Abnormal; Notable for the following components:   WBC 11.1 (*)    RBC 5.21 (*)    Platelets 431 (*)    All other components within normal limits  URINALYSIS, ROUTINE W REFLEX MICROSCOPIC  CBG MONITORING, ED  I ordered and reviewed the above labs they are notable for she has an increased creatinine of 2.04 compared to prior of 1.0 approximately 1 week ago when she was discharged from the hospital at [facility] for CVA.  EKG  ED ECG REPORT I, Pilar Jarvis, the attending physician, personally viewed and interpreted this ECG.   Date: 05/09/2023  EKG Time: 1533  Rate: 54  Rhythm: sinus  Axis: nl  Intervals:rbbb  ST&T Change: no stemi --leads III and V3 have T wave inversions      PROCEDURES:  Critical Care performed: No  Procedures   MEDICATIONS ORDERED IN ED: Medications  lactated ringers bolus 1,000 mL (has no administration in time range)    External physician / consultants:  I spoke with hospitalist for admit and regarding care plan for this patient.   IMPRESSION / MDM / ASSESSMENT AND PLAN / ED COURSE  I reviewed the  triage vital signs and the nursing notes.                                Patient's presentation is most consistent with acute presentation with potential threat to life or bodily function.  Differential diagnosis includes, but is not limited to, dehydration, AKI, electrolyte derangements, deconditioning, stroke   The patient is on the cardiac monitor to evaluate for evidence of arrhythmia and/or significant heart rate changes.  MDM: This is a patient with generalized weakness appears dehydrated and AKI with poor p.o. intake and diarrhea since her discharge from her hospital for stroke last week.  She has been making no progress with rehab.  She has no focal infectious symptoms or no new focal neurologic deficits to suggest infection or new stroke.  Minor trauma today with abrasion to the right knee no bony tenderness full range of motion neurovascular intact so I doubt fracture or dislocation.  No head strike so I doubt intracranial bleeding.  Given her generalized weakness and poor progression at home with AKI dehydration I will have her admitted to the hospital here in order for PT evaluation as well.  1 L of LR ordered.        FINAL CLINICAL IMPRESSION(S) / ED DIAGNOSES   Final diagnoses:  Generalized weakness  AKI (acute kidney injury) (HCC)  Dehydration     Rx / DC Orders   ED Discharge Orders     None        Note:  This document was prepared using Dragon voice recognition software and may include unintentional dictation errors.    Pilar Jarvis, MD 05/09/23 1754

## 2023-05-09 NOTE — Assessment & Plan Note (Addendum)
Generalized weakness along with persistent right-sided weakness post left PCA stroke in early May 2024 Patient had home PT and not progressing well  PT, OT Fall precautions

## 2023-05-09 NOTE — Hospital Course (Addendum)
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 05/16: VSS mild bradycardia. Hyponatremia w/ sodium is 130, hyperkalemia w/ potassium 5.2, chloride 100, low bicarb 15, BUN 107, serum creatinine of 2.04 (baseline 1.0), EGFR of 26, WBC 11.1, hemoglobin 13.8, platelets of 431. UA no UTI. CXR no concerns. Admitted to hospitalist service for hyponatremia and AKI.  05/17: Sodium 133. Cr 1.64, BUN 96, GFR 34. US renal normal kidneys/bladder. D/c IV fluids and encourage po intake. PT?OT recs for SNF/STR  05/18 (Sat): Cr 1.59, Na 134. Will be here thru weekend awaiting STR placement Monday, auth in progress, continue monitor labs  05/19: poor po intake yesterday, Cr back up today, restart IV fluids     Consultants:  none  Procedures: none      ASSESSMENT & PLAN:   Principal Problem:   Hyponatremia Active Problems:   Hyperkalemia   AKI (acute kidney injury) (HCC)   History of left PCA stroke   Hyperlipemia   Diarrhea   Leukocytosis   Weakness  Hyponatremia - improving  Presumed secondary to GI loss and poor appetite encourage po hydration Monitor BMP   Weakness Generalized weakness along with persistent right-sided weakness post left PCA stroke in early May 2024. Per chart review, agitation and behavioral changes since stroke.  PT, OT recs for SNF/STR placement planned  Fall precautions   AKI (acute kidney injury) (HCC) - improving but renal function not yet at baseline Presumed prerenal secondary to GI loss Baseline creatinine level is 1.0/eGFR 62 D/c IV fluids, encourage po intake   Leukocytosis - improving  No concerns chest x-ray, UA, procalcitonin Monitor CBC   Diarrhea - resolved cancel GI panel and C. difficile panel    Hyperlipemia Atorvastatin 10 mg qhs resumed    History of left PCA stroke Patient on DAPT, aspirin and plavix   Hyperkalemia - resolved presumed secondary to acute kidney injury Monitor BMP    DVT prophylaxis: lovenox  Pertinent IV fluids/nutrition: d/c IV fluids today, encourage po intake and monitor labs  Central lines / invasive devices: none  Code Status: FULL CODE ACP documentation reviewed: 05/10/23 none in [facility]  Current Admission Status: inpatient  TOC needs / Dispo plan: SNF/STR Barriers to discharge / significant pending items: placement, improvement in labs/renal fxn

## 2023-05-09 NOTE — Assessment & Plan Note (Addendum)
Presumed secondary to GI loss and poor appetite Patient is status post LR 1 L bolus per EDP Sodium chloride 500 mL bolus one-time dose ordered Sodium chloride 100 mL/h, 1 day ordered

## 2023-05-09 NOTE — ED Notes (Signed)
Request made for transport to the floor ?

## 2023-05-10 ENCOUNTER — Inpatient Hospital Stay: Payer: Medicare Other

## 2023-05-10 DIAGNOSIS — E871 Hypo-osmolality and hyponatremia: Secondary | ICD-10-CM | POA: Diagnosis not present

## 2023-05-10 LAB — URINALYSIS, ROUTINE W REFLEX MICROSCOPIC
Bilirubin Urine: NEGATIVE
Glucose, UA: 50 mg/dL — AB
Hgb urine dipstick: NEGATIVE
Ketones, ur: NEGATIVE mg/dL
Leukocytes,Ua: NEGATIVE
Nitrite: NEGATIVE
Protein, ur: NEGATIVE mg/dL
Specific Gravity, Urine: 1.015 (ref 1.005–1.030)
pH: 5 (ref 5.0–8.0)

## 2023-05-10 LAB — CBC
HCT: 38.4 % (ref 36.0–46.0)
Hemoglobin: 12.3 g/dL (ref 12.0–15.0)
MCH: 26.5 pg (ref 26.0–34.0)
MCHC: 32 g/dL (ref 30.0–36.0)
MCV: 82.8 fL (ref 80.0–100.0)
Platelets: 364 10*3/uL (ref 150–400)
RBC: 4.64 MIL/uL (ref 3.87–5.11)
RDW: 15 % (ref 11.5–15.5)
WBC: 10.6 10*3/uL — ABNORMAL HIGH (ref 4.0–10.5)
nRBC: 0 % (ref 0.0–0.2)

## 2023-05-10 LAB — BASIC METABOLIC PANEL
Anion gap: 11 (ref 5–15)
BUN: 96 mg/dL — ABNORMAL HIGH (ref 8–23)
CO2: 19 mmol/L — ABNORMAL LOW (ref 22–32)
Calcium: 9.1 mg/dL (ref 8.9–10.3)
Chloride: 107 mmol/L (ref 98–111)
Creatinine, Ser: 1.64 mg/dL — ABNORMAL HIGH (ref 0.44–1.00)
GFR, Estimated: 34 mL/min — ABNORMAL LOW (ref >60)
Glucose, Bld: 86 mg/dL (ref 70–99)
Potassium: 4.4 mmol/L (ref 3.5–5.1)
Sodium: 133 mmol/L — ABNORMAL LOW (ref 135–145)

## 2023-05-10 LAB — PROCALCITONIN: Procalcitonin: 0.12 ng/mL

## 2023-05-10 MED ORDER — POTASSIUM CHLORIDE CRYS ER 20 MEQ PO TBCR
40.0000 meq | EXTENDED_RELEASE_TABLET | Freq: Every day | ORAL | Status: DC
  Administered 2023-05-10 – 2023-05-12 (×2): 40 meq via ORAL
  Filled 2023-05-10 (×3): qty 2

## 2023-05-10 MED ORDER — CLONIDINE HCL 0.1 MG PO TABS
0.1000 mg | ORAL_TABLET | Freq: Two times a day (BID) | ORAL | Status: DC
  Administered 2023-05-10 – 2023-05-13 (×6): 0.1 mg via ORAL
  Filled 2023-05-10 (×7): qty 1

## 2023-05-10 MED ORDER — HYDROCHLOROTHIAZIDE 25 MG PO TABS
25.0000 mg | ORAL_TABLET | Freq: Every day | ORAL | Status: DC
  Administered 2023-05-10 – 2023-05-13 (×4): 25 mg via ORAL
  Filled 2023-05-10 (×4): qty 1

## 2023-05-10 MED ORDER — IRBESARTAN 150 MG PO TABS
300.0000 mg | ORAL_TABLET | Freq: Every day | ORAL | Status: DC
  Administered 2023-05-10 – 2023-05-13 (×4): 300 mg via ORAL
  Filled 2023-05-10 (×4): qty 2

## 2023-05-10 MED ORDER — ENOXAPARIN SODIUM 40 MG/0.4ML IJ SOSY
40.0000 mg | PREFILLED_SYRINGE | Freq: Every day | INTRAMUSCULAR | Status: DC
  Administered 2023-05-10 – 2023-05-11 (×2): 40 mg via SUBCUTANEOUS
  Filled 2023-05-10 (×2): qty 0.4

## 2023-05-10 MED ORDER — CLOPIDOGREL BISULFATE 75 MG PO TABS
75.0000 mg | ORAL_TABLET | Freq: Every day | ORAL | Status: DC
  Administered 2023-05-10 – 2023-05-13 (×4): 75 mg via ORAL
  Filled 2023-05-10 (×4): qty 1

## 2023-05-10 MED ORDER — OLMESARTAN MEDOXOMIL-HCTZ 40-25 MG PO TABS: 1.0000 | ORAL_TABLET | Freq: Every day | ORAL | Status: DC

## 2023-05-10 MED ORDER — NICOTINE 7 MG/24HR TD PT24
7.0000 mg | MEDICATED_PATCH | Freq: Every day | TRANSDERMAL | Status: DC
  Filled 2023-05-10 (×4): qty 1

## 2023-05-10 MED ORDER — ASPIRIN 81 MG PO CHEW
81.0000 mg | CHEWABLE_TABLET | Freq: Every day | ORAL | Status: DC
  Administered 2023-05-10 – 2023-05-13 (×4): 81 mg via ORAL
  Filled 2023-05-10 (×4): qty 1

## 2023-05-10 MED ORDER — CARVEDILOL 6.25 MG PO TABS
6.2500 mg | ORAL_TABLET | Freq: Two times a day (BID) | ORAL | Status: DC
  Administered 2023-05-10 – 2023-05-13 (×4): 6.25 mg via ORAL
  Filled 2023-05-10 (×5): qty 1

## 2023-05-10 NOTE — Evaluation (Signed)
Physical Therapy Evaluation Patient Details Name: Briana Wright MRN: [id number] DOB: Jun 13, 1956 Today's Date: 05/10/2023  History of Present Illness  [name] is a 67 year old female with prior history of left PCA stroke, on dual antiplatelet, hyperlipidemia, hypertension, who presents emergency department for chief concerns of weakness.     Vitals in the ED showed temperature 98.1, respiration of 18, heart rate of 55, blood pressure 109/68, SpO2 of 96 percent on room air.     Serum sodium is 130, potassium 5.2, chloride 100, bicarb 15, BUN 107, serum creatinine of 2.04, EGFR of 26, WBC 11.1, hemoglobin 13.8, platelets of 431.   Clinical Impression  Pt admitted with above diagnosis. Pt received sitting upright EOB in care of OT. Pt generally agitated/annoyed throughout PT/OT eval with limited input to assist in home lay out and assist levels at home and compliance with evaluations. Pt with acute CVA earlier this month supposedly leading to cognitive/behavioral/visual changes per niece over the phone according to OT via secure chat after rehab evals were complete. Pt does report prior to her CVA she was very much independent and still working.   To date, Pt presents with generalized weakness and hesitancy relying on increased time, encouragement and multimodal cuing to stand with 2 person HHA and ambulates around foot of bed to recliner. With with laborious gait, very guarded and appears fearful reporting weakness since her CVA earlier this month. Pt placed in recliner with all needs in reach. Pt will benefit from additional PT resources at discharge to address these deficits prior to returning to home environment.      Recommendations for follow up therapy are one component of a multi-disciplinary discharge planning process, led by the attending physician.  Recommendations may be updated based on patient status, additional functional criteria and insurance authorization.  Follow Up  Recommendations Can patient physically be transported by private vehicle: No     Assistance Recommended at Discharge Frequent or constant Supervision/Assistance  Patient can return home with the following  A lot of help with walking and/or transfers;A little help with bathing/dressing/bathroom;Assistance with cooking/housework;Help with stairs or ramp for entrance    Equipment Recommendations Other (comment) (TBD by next venue of care)  Recommendations for Other Services       Functional Status Assessment Patient has had a recent decline in their functional status and demonstrates the ability to make significant improvements in function in a reasonable and predictable amount of time.     Precautions / Restrictions Precautions Precautions: Fall Restrictions Weight Bearing Restrictions: No      Mobility  Bed Mobility               General bed mobility comments: NT. Seated upright with OT upon entry. Patient Response: Anxious, Restless  Transfers Overall transfer level: Needs assistance Equipment used: 2 person hand held assist Transfers: Sit to/from Stand Sit to Stand: Min guard                Ambulation/Gait Ambulation/Gait assistance: +2 safety/equipment Gait Distance (Feet): 15 Feet Assistive device: 2 person hand held assist Gait Pattern/deviations: Step-through pattern, Decreased step length - right, Decreased step length - left       General Gait Details: Very guarded, slowed gait. 2 person HHA throughout.  Stairs            Wheelchair Mobility    Modified Rankin (Stroke Patients Only)       Balance Overall balance assessment: Needs assistance Sitting-balance support: Single extremity supported Sitting  balance-Leahy Scale: Fair     Standing balance support: During functional activity, Bilateral upper extremity supported Standing balance-Leahy Scale: Fair Standing balance comment: 2 person HHA                              Pertinent Vitals/Pain Pain Assessment Pain Assessment: No/denies pain    Home Living Family/patient expects to be discharged to:: Private residence Living Arrangements: Spouse/significant other Available Help at Discharge: Family;Available PRN/intermittently Type of Home: House Home Access: Stairs to enter   [employer] of Steps: pt unsure   Home Layout: One level   Additional Comments: Pt is unable to provide any details re: her home setup or PLOF. She does state that she takes care of her husband, but no additional details.    Prior Function               Mobility Comments: Per report for niece, pt ambulated w/o AD. ADLs Comments: Pt unable to provide clear information, but does state at one point that she drove, did the grocery shopping, cooking, and housework     Hand Dominance        Extremity/Trunk Assessment   Upper Extremity Assessment Upper Extremity Assessment: Generalized weakness    Lower Extremity Assessment Lower Extremity Assessment: Generalized weakness    Cervical / Trunk Assessment Cervical / Trunk Assessment: Normal  Communication   Communication: No difficulties  Cognition Arousal/Alertness: Awake/alert Behavior During Therapy: Restless, Anxious Overall Cognitive Status: Impaired/Different from baseline                                 General Comments: Pt aware she is at "a hospital," but is unable to identify. She is able to state her birthday but cannot name the current day, month, or year. She is unable to recall the name of the president of the [place]. Her conversation is largely disjointed, tangential. Pt argumentative, unhappy, and somewhat paranoid (e.g., asked pt to try to come into standing at side of pt; pt responded that she knew if she wasn't able to do this, then we would not feed her).        General Comments General comments (skin integrity, edema, etc.): Per phone conversation with pt's niece, pt  fell off the toilet at home prior to coming to hospital yesterday.    Exercises Other Exercises Other Exercises: Role of PT in acute setting   Assessment/Plan    PT Assessment Patient needs continued PT services  PT Problem List Decreased strength;Decreased safety awareness;Decreased activity tolerance;Decreased cognition;Decreased balance       PT Treatment Interventions DME instruction;Therapeutic activities;Gait training;Therapeutic exercise;Patient/family education;Stair training;Balance training;Functional mobility training;Neuromuscular re-education    PT Goals (Current goals can be found in the Care Plan section)  Acute Rehab PT Goals PT Goal Formulation: Patient unable to participate in goal setting    Frequency Min 2X/week     Co-evaluation PT/OT/SLP Co-Evaluation/Treatment: Yes Reason for Co-Treatment: Complexity of the patient's impairments (multi-system involvement);Necessary to address cognition/behavior during functional activity;For patient/therapist safety PT goals addressed during session: Mobility/safety with mobility OT goals addressed during session: ADL's and self-care       AM-PAC PT "6 Clicks" Mobility  Outcome Measure Help needed turning from your back to your side while in a flat bed without using bedrails?: A Little Help needed moving from lying on your back to sitting on the side  of a flat bed without using bedrails?: A Little Help needed moving to and from a bed to a chair (including a wheelchair)?: A Lot Help needed standing up from a chair using your arms (e.g., wheelchair or bedside chair)?: A Little Help needed to walk in hospital room?: A Lot Help needed climbing 3-5 steps with a railing? : A Lot 6 Click Score: 15    End of Session   Activity Tolerance: Treatment limited secondary to agitation Patient left: in chair;with call bell/phone within reach;with chair alarm set Nurse Communication: Mobility status PT Visit Diagnosis: Other  abnormalities of gait and mobility (R26.89);Muscle weakness (generalized) (M62.81);History of falling (Z91.81);Difficulty in walking, not elsewhere classified (R26.2)    Time: 1096-0454 PT Time Calculation (min) (ACUTE ONLY): 19 min   Charges:   PT Evaluation $PT Eval Moderate Complexity: 1 Mod         Toa Mia M. Fairly IV, PT, DPT Physical Therapist- [place]  [facility]  05/10/2023, 11:38 AM

## 2023-05-10 NOTE — Progress Notes (Signed)
PROGRESS NOTE    Briana Wright   QMV:[id number] DOB: 12/02/1956  DOA: 05/09/2023 Date of Service: 05/10/23 PCP: Corky Downs, MD     Brief Narrative / Hospital Course:  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 05/16: VSS mild bradycardia. Hyponatremia w/ sodium is 130, hyperkalemia w/ potassium 5.2, chloride 100, low bicarb 15, BUN 107, serum creatinine of 2.04 (baseline 1.0), EGFR of 26, WBC 11.1, hemoglobin 13.8, platelets of 431. UA no UTI. CXR no concerns. Admitted to hospitalist service for hyponatremia and AKI.  05/17: Sodium 133. Cr 1.64, BUN 96, GFR 34. US renal normal kidneys/bladder. D/c IV fluids and encourage po intake     Consultants:  none  Procedures: none      ASSESSMENT & PLAN:   Principal Problem:   Hyponatremia Active Problems:   Hyperkalemia   AKI (acute kidney injury) (HCC)   History of left PCA stroke   Hyperlipemia   Diarrhea   Leukocytosis   Weakness  Hyponatremia - improving  Presumed secondary to GI loss and poor appetite Patient is status post LR 1 L bolus per EDP, additional 500 mL bolus one-time dose ordered by admitting hospitalist  D/c IV fluids given improvement, able to take po  Monitor BMP   Weakness Generalized weakness along with persistent right-sided weakness post left PCA stroke in early May 2024. Per chart review, agitation and behavioral changes since stroke. Patient had home PT and not progressing well  PT, OT Fall precautions   Leukocytosis - improving  No concerns chest x-ray, UA, procalcitonin Monitor CBC   Diarrhea - resolved Check GI panel and C. difficile panel --> needs collected   Hyperlipemia Atorvastatin 10 mg qhs resumed   History of left PCA stroke Patient on DAPT, aspirin and plavix   AKI  (acute kidney injury) (HCC) - improving  Presumed prerenal secondary to GI loss Baseline creatinine level is 1.0/eGFR 62 D/c IV fluids, encourage po intake    Hyperkalemia - resolved presumed secondary to acute kidney injury Monitor BMP    DVT prophylaxis: lovenox  Pertinent IV fluids/nutrition: d/c IV fluids today, encourage po intake and monitor labs  Central lines / invasive devices: none  Code Status: FULL CODE ACP documentation reviewed: 05/10/23 none in [facility]  Current Admission Status: inpatient  TOC needs / Dispo plan: SNF/STR Barriers to discharge / significant pending items: placement, improvement in labs/renal fxn              Subjective / Brief ROS:  Patient reports no concerns feeling better today but still generally weak Denies CP/SOB.  Pain controlled.  Denies new weakness.  Tolerating diet.  Reports no concerns w/ urination/defecation.   Family Communication: spoke w/ niece Britta Mccreedy on the phone 05/10/23 2:07 PM     Objective Findings:  Vitals:   05/09/23 2034 05/09/23 2208 05/10/23 0452 05/10/23 0816  BP: 112/78 (!) 148/76 131/83 128/87  Pulse: 64 (!) 59 (!) 58 (!) 55  Resp: 17 18 18 17   Temp: 98.7 F (37.1 C) 97.6 F (36.4 C) 97.7 F (36.5 C) 97.7 F (36.5 C)  TempSrc: Oral Oral Oral Oral  SpO2: 98% 100% 100% 100%  Weight:      Height:        Intake/Output Summary (Last 24 hours) at 05/10/2023 1407 Last data filed at 05/10/2023 0445 Gross per  24 hour  Intake 740 ml  Output --  Net 740 ml   Filed Weights   05/09/23 2020  Weight: 72.6 kg    Examination:  Physical Exam Constitutional:      General: She is not in acute distress. Cardiovascular:     Rate and Rhythm: Normal rate and regular rhythm.     Heart sounds: Normal heart sounds.  Pulmonary:     Effort: Pulmonary effort is normal.     Breath sounds: Normal breath sounds.  Abdominal:     Palpations: Abdomen is soft.  Neurological:     Mental Status: She is alert.  Mental status is at baseline.  Psychiatric:        Mood and Affect: Mood normal.        Behavior: Behavior normal.        Thought Content: Thought content normal.          Scheduled Medications:   aspirin  81 mg Oral Daily   atorvastatin  10 mg Oral q1800   carvedilol  6.25 mg Oral BID WC   cloNIDine  0.1 mg Oral Q12H   clopidogrel  75 mg Oral Daily   enoxaparin (LOVENOX) injection  30 mg Subcutaneous QHS   nicotine  7 mg Transdermal Daily   olmesartan-hydrochlorothiazide  1 tablet Oral Daily   potassium chloride SA  40 mEq Oral Daily    Continuous Infusions:   PRN Medications:  acetaminophen **OR** acetaminophen, ondansetron **OR** ondansetron (ZOFRAN) IV, senna-docusate  Antimicrobials from admission:  Anti-infectives (From admission, onward)    None           Data Reviewed:  I have personally reviewed the following...  CBC: Recent Labs  Lab 05/09/23 1526 05/10/23 0503  WBC 11.1* 10.6*  HGB 13.8 12.3  HCT 43.9 38.4  MCV 84.3 82.8  PLT 431* 364   Basic Metabolic Panel: Recent Labs  Lab 05/09/23 1526 05/10/23 0503  NA 130* 133*  K 5.2* 4.4  CL 100 107  CO2 15* 19*  GLUCOSE 97 86  BUN 107* 96*  CREATININE 2.04* 1.64*  CALCIUM 9.9 9.1   GFR: Estimated Creatinine Clearance: 31.8 mL/min (A) (by C-G formula based on SCr of 1.64 mg/dL (H)). Liver Function Tests: No results for input(s): "AST", "ALT", "ALKPHOS", "BILITOT", "PROT", "ALBUMIN" in the last 168 hours. No results for input(s): "LIPASE", "AMYLASE" in the last 168 hours. No results for input(s): "AMMONIA" in the last 168 hours. Coagulation Profile: No results for input(s): "INR", "PROTIME" in the last 168 hours. Cardiac Enzymes: No results for input(s): "CKTOTAL", "CKMB", "CKMBINDEX", "TROPONINI" in the last 168 hours. BNP (last 3 results) No results for input(s): "PROBNP" in the last 8760 hours. HbA1C: No results for input(s): "HGBA1C" in the last 72 hours. CBG: No results for  input(s): "GLUCAP" in the last 168 hours. Lipid Profile: No results for input(s): "CHOL", "HDL", "LDLCALC", "TRIG", "CHOLHDL", "LDLDIRECT" in the last 72 hours. Thyroid Function Tests: No results for input(s): "TSH", "T4TOTAL", "FREET4", "T3FREE", "THYROIDAB" in the last 72 hours. Anemia Panel: No results for input(s): "VITAMINB12", "FOLATE", "FERRITIN", "TIBC", "IRON", "RETICCTPCT" in the last 72 hours. Most Recent Urinalysis On File:     Component Value Date/Time   COLORURINE YELLOW (A) 05/10/2023 0245   APPEARANCEUR HAZY (A) 05/10/2023 0245   LABSPEC 1.015 05/10/2023 0245   PHURINE 5.0 05/10/2023 0245   GLUCOSEU 50 (A) 05/10/2023 0245   HGBUR NEGATIVE 05/10/2023 0245   BILIRUBINUR NEGATIVE 05/10/2023 0245   KETONESUR NEGATIVE 05/10/2023  0245   PROTEINUR NEGATIVE 05/10/2023 0245   NITRITE NEGATIVE 05/10/2023 0245   LEUKOCYTESUR NEGATIVE 05/10/2023 0245   Sepsis Labs: @LABRCNTIP (procalcitonin:4,lacticidven:4) Microbiology: No results found for this or any previous visit (from the past 240 hour(s)).    Radiology Studies last 3 days: US RENAL  Result Date: 05/10/2023 CLINICAL DATA:  67 year old female with acute renal insufficiency. Hyperkalemia. EXAM: RENAL / URINARY TRACT ULTRASOUND COMPLETE COMPARISON:  None Available. FINDINGS: Right Kidney: Renal measurements: 12.7 x 6.0 x 6.3 cm = volume: 251 mL. Echogenicity within normal limits. Column of Bertin, normal variant. No mass or hydronephrosis visualized. Left Kidney: Renal measurements: 11.4 x 5.9 x 4.8 cm = volume: 168 mL. Echogenicity within normal limits. No mass or hydronephrosis visualized. Bladder: Appears normal for degree of bladder distention. No urinary debris is evident. Other: None. IMPRESSION: Normal ultrasound appearance of both kidneys and the urinary bladder. Electronically Signed   By: Odessa Fleming M.D.   On: 05/10/2023 07:11   DG Chest Port 1 View  Result Date: 05/09/2023 CLINICAL DATA:  141835 Weakness 141835 10026  Shortness of breath 10026 98519 Hyponatremia 45409 EXAM: PORTABLE CHEST 1 VIEW COMPARISON:  Chest x-ray 10/07/2015 FINDINGS: The heart and mediastinal contours are unchanged. Aortic calcification Right base and lingular atelectasis versus scarring. No focal consolidation. No pulmonary edema. No pleural effusion. No pneumothorax. No acute osseous abnormality. IMPRESSION: No active disease. Electronically Signed   By: Tish Frederickson M.D.   On: 05/09/2023 19:33             LOS: 1 day      Sunnie Nielsen, DO [facility] 05/10/2023, 2:07 PM    Dictation software may have been used to generate the above note. Typos may occur and escape review in typed/dictated notes. Please contact Dr Lyn Hollingshead directly for clarity if needed.  Staff may message me via secure chat in Epic  but this may not receive an immediate response,  please page me for urgent matters!  If 7PM-7AM, please contact night coverage [email or web address]

## 2023-05-10 NOTE — TOC Initial Note (Addendum)
Transition of Care [facility]) - Initial/Assessment Note    Patient Details  Name: Briana Wright MRN: [id number] Date of Birth: [birth year]  [facility]) CM/SW Contact:    Liliana Cline, LCSW Phone Number: 05/10/2023, 3:04 PM  Clinical Narrative:                 Spoke with niece via phone. Patient is from home with her spouse. PT and OT recommend SNF. Family is agreeable to SNF, prefers [facility]. Referral sent and CSW reached out to [facility] who stated they would have a bed for patient on Monday if medically ready and pending insurance authorization. Updated niece and DO. Auth to be started this weekend in preparation.  Expected Discharge Plan: Skilled Nursing Facility Barriers to Discharge: Continued Medical Work up   Patient Goals and CMS Choice Patient states their goals for this hospitalization and ongoing recovery are:: SNF CMS Medicare.gov Compare Post Acute Care list provided to:: Patient Represenative (must comment) Choice offered to / list presented to : NA      Expected Discharge Plan and Services       Living arrangements for the past 2 months: Single Family Home                                      Prior Living Arrangements/Services Living arrangements for the past 2 months: Single Family Home Lives with:: Spouse Patient language and need for interpreter reviewed:: Yes Do you feel safe going back to the place where you live?: Yes      Need for Family Participation in Patient Care: Yes (Comment) Care giver support system in place?: Yes (comment)   Criminal Activity/Legal Involvement Pertinent to Current Situation/Hospitalization: No - Comment as needed  Activities of Daily Living Home Assistive Devices/Equipment: Cane (specify quad or straight) ADL Screening (condition at time of admission) Patient's cognitive ability adequate to safely complete daily activities?: Yes Is the patient deaf or have difficulty hearing?: No Does the  patient have difficulty seeing, even when wearing glasses/contacts?: No Does the patient have difficulty concentrating, remembering, or making decisions?: No Patient able to express need for assistance with ADLs?: Yes Does the patient have difficulty dressing or bathing?: No Independently performs ADLs?: Yes (appropriate for developmental age) Does the patient have difficulty walking or climbing stairs?: Yes Weakness of Legs: Both Weakness of Arms/Hands: None  Permission Sought/Granted Permission sought to share information with : Facility Industrial/product designer granted to share information with : Yes, Verbal Permission Granted     Permission granted to share info w AGENCY: SNF        Emotional Assessment         Alcohol / Substance Use: Not Applicable Psych Involvement: No (comment)  Admission diagnosis:  Dehydration [E86.0] Hyponatremia [E87.1] Generalized weakness [R53.1] AKI (acute kidney injury) (HCC) [N17.9] Patient Active Problem List   Diagnosis Date Noted   Hyponatremia 05/09/2023   Hyperkalemia 05/09/2023   AKI (acute kidney injury) (HCC) 05/09/2023   History of left PCA stroke 05/09/2023   Hyperlipemia 05/09/2023   Diarrhea 05/09/2023   Leukocytosis 05/09/2023   Weakness 05/09/2023   Chest pain 10/07/2015   PCP:  Corky Downs, MD Pharmacy:   [facility] [place], [place] - [address] [place] [address] Phone: [phone] Fax: [phone]     Social Determinants of Health (SDOH) Social History: SDOH Screenings  Food Insecurity: No Food Insecurity (05/09/2023)  Housing: Low Risk  (05/09/2023)  Transportation Needs: No Transportation Needs (05/09/2023)  Utilities: Not At Risk (05/09/2023)  Tobacco Use: High Risk (05/09/2023)   SDOH Interventions:     Readmission Risk Interventions     No data to display

## 2023-05-10 NOTE — NC FL2 (Signed)
[place] MEDICAID FL2 LEVEL OF CARE FORM     IDENTIFICATION  Patient Name: Briana Wright Birthdate: 1956/10/28 Sex: female Admission Date (Current Location): 05/09/2023  [facility] and [place] Number:  Chiropodist and Address:  [facility], [address], [place], [place] [address]      Provider Number: [id number]  Attending Physician Name and Address:  Sunnie Nielsen, DO  Relative Name and Phone Number:  Clapp,Barbara Niece   [phone]    Current Level of Care: Hospital Recommended Level of Care: Skilled Nursing Facility Prior Approval Number:    Date Approved/Denied:   PASRR Number: [id number]  Discharge Plan:      Current Diagnoses: Patient Active Problem List   Diagnosis Date Noted   Hyponatremia 05/09/2023   Hyperkalemia 05/09/2023   AKI (acute kidney injury) (HCC) 05/09/2023   History of left PCA stroke 05/09/2023   Hyperlipemia 05/09/2023   Diarrhea 05/09/2023   Leukocytosis 05/09/2023   Weakness 05/09/2023   Chest pain 10/07/2015    Orientation RESPIRATION BLADDER Height & Weight     Self, Time, Situation, Place  Normal Continent Weight: 160 lb (72.6 kg) Height:  5' 2.99" (160 cm)  BEHAVIORAL SYMPTOMS/MOOD NEUROLOGICAL BOWEL NUTRITION STATUS      Continent Diet (Heart)  AMBULATORY STATUS COMMUNICATION OF NEEDS Skin   Limited Assist Verbally Skin abrasions, Bruising                       Personal Care Assistance Level of Assistance  Bathing, Feeding, Dressing Bathing Assistance: Limited assistance Feeding assistance: Limited assistance Dressing Assistance: Limited assistance     Functional Limitations Info             SPECIAL CARE FACTORS FREQUENCY  PT (By licensed PT), OT (By licensed OT)     PT Frequency: 5 times per week OT Frequency: 5 times per week            Contractures      Additional Factors Info  Code Status, Allergies Code Status Info: full Allergies Info: nka            Current Medications (05/10/2023):  This is the current hospital active medication list Current Facility-Administered Medications  Medication Dose Route Frequency Provider Last Rate Last Admin   acetaminophen (TYLENOL) tablet 650 mg  650 mg Oral Q6H PRN Cox, Amy N, DO   650 mg at 05/10/23 8469   Or   acetaminophen (TYLENOL) suppository 650 mg  650 mg Rectal Q6H PRN Cox, Amy N, DO       aspirin chewable tablet 81 mg  81 mg Oral Daily Sunnie Nielsen, DO       atorvastatin (LIPITOR) tablet 10 mg  10 mg Oral q1800 Cox, Amy N, DO       carvedilol (COREG) tablet 6.25 mg  6.25 mg Oral BID WC Sunnie Nielsen, DO       cloNIDine (CATAPRES) tablet 0.1 mg  0.1 mg Oral Q12H Sunnie Nielsen, DO       clopidogrel (PLAVIX) tablet 75 mg  75 mg Oral Daily Sunnie Nielsen, DO       enoxaparin (LOVENOX) injection 30 mg  30 mg Subcutaneous QHS Cox, Amy N, DO   30 mg at 05/09/23 2204   nicotine (NICODERM CQ - dosed in mg/24 hr) patch 7 mg  7 mg Transdermal Daily Sunnie Nielsen, DO       olmesartan-hydrochlorothiazide (BENICAR HCT) 40-25 MG per tablet 1 tablet  1 tablet  Oral Daily Sunnie Nielsen, DO       ondansetron [facility]) tablet 4 mg  4 mg Oral Q6H PRN Cox, Amy N, DO       Or   ondansetron (ZOFRAN) injection 4 mg  4 mg Intravenous Q6H PRN Cox, Amy N, DO   4 mg at 05/09/23 1900   potassium chloride SA (KLOR-CON M) CR tablet 40 mEq  40 mEq Oral Daily Sunnie Nielsen, DO       senna-docusate (Senokot-S) tablet 1 tablet  1 tablet Oral QHS PRN Cox, Amy N, DO         Discharge Medications: Please see discharge summary for a list of discharge medications.  Relevant Imaging Results:  Relevant Lab Results:   Additional Information SS #: [id number]  Yuk[employer] E Dari Carpenito, LCSW

## 2023-05-10 NOTE — Evaluation (Signed)
Occupational Therapy Evaluation Patient Details Name: Lennis Dynes MRN: [id number] DOB: 03-09-1956 Today's Date: 05/10/2023   History of Present Illness Ms. Zarionna Reichard is a 67 year old female with prior history of left PCA stroke, on dual antiplatelet, hyperlipidemia, hypertension, who presents emergency department for chief concerns of weakness.     Vitals in the ED showed temperature 98.1, respiration of 18, heart rate of 55, blood pressure 109/68, SpO2 of 96 percent on room air.     Serum sodium is 130, potassium 5.2, chloride 100, bicarb 15, BUN 107, serum creatinine of 2.04, EGFR of 26, WBC 11.1, hemoglobin 13.8, platelets of 431.   Clinical Impression   Pt presents with generalized weakness, limited endurance, and agitation. Pt is able to provided little information re: her PLOF, home set up, or current situation. Per phone conversation w/ pt's niece, pt was admitted to [facility] from May 4-7, 2024, for a CVA, DCed home with [facility]; however, pt has been unable to take care of herself since returning home. She has rarely gotten out of bed, has not been eating or drinking, and had a fall at home, which is what lead to niece calling EMS and having pt transported to [facility]. Niece reports that pt was generally able-bodied and active prior to the stroke earlier this month. During today's evaluation, pt required increased time, multi-modal cueing, and ongoing encouragement to participate in session. She is reluctant to get out of bed; appears fearful of walking; has a slow, staggered gait. She is able to supply her birthdate and knows she is at "a hospital." She cannot state day, month, or year. It appears that [facility] has not been adequate to address pt's needs post CVA, recommend STR post DC.     Recommendations for follow up therapy are one component of a multi-disciplinary discharge planning process, led by the attending physician.  Recommendations may be updated based on patient status, additional  functional criteria and insurance authorization.   Assistance Recommended at Discharge Frequent or constant Supervision/Assistance  Patient can return home with the following A little help with walking and/or transfers;A little help with bathing/dressing/bathroom;Assistance with cooking/housework;Direct supervision/assist for medications management;Assist for transportation;Help with stairs or ramp for entrance    Functional Status Assessment  Patient has had a recent decline in their functional status and demonstrates the ability to make significant improvements in function in a reasonable and predictable amount of time.  Equipment Recommendations  Other (comment) (pt unable to state today what equipment she has at home.)    Recommendations for Other Services       Precautions / Restrictions Precautions Precautions: Fall Restrictions Weight Bearing Restrictions: No      Mobility Bed Mobility Overal bed mobility: Needs Assistance Bed Mobility: Supine to Sit     Supine to sit: Min guard     General bed mobility comments: Required extensive coaxing for pt to come to EOB sitting    Transfers Overall transfer level: Needs assistance Equipment used: 2 person hand held assist Transfers: Sit to/from Stand, Bed to chair/wheelchair/BSC Sit to Stand: Min guard     Step pivot transfers: Min assist            Balance Overall balance assessment: Needs assistance Sitting-balance support: Single extremity supported Sitting balance-Leahy Scale: Fair   Postural control: Left lateral lean Standing balance support: During functional activity, Bilateral upper extremity supported Standing balance-Leahy Scale: Fair Standing balance comment: Requires 2-hand-held UE support, encouragement to participate, and coaching for maneuvering around objects in room  and for positioning directly in front of chair prior to sitting                           ADL either performed or  assessed with clinical judgement   ADL Overall ADL's : Needs assistance/impaired Eating/Feeding: Minimal assistance Eating/Feeding Details (indicate cue type and reason): Pt struggled to figure out how to remove lid from plate; required assistance with opening containers                 Lower Body Dressing: Supervision/safety;Sitting/lateral leans                       Vision   Vision Assessment?: Vision impaired- to be further tested in functional context Additional Comments: Niece reports pt has visual deficit in R eye since the stroke earlier this month.     Perception     Praxis      Pertinent Vitals/Pain Pain Assessment Pain Assessment: No/denies pain     Hand Dominance     Extremity/Trunk Assessment Upper Extremity Assessment Upper Extremity Assessment: Generalized weakness   Lower Extremity Assessment Lower Extremity Assessment: Generalized weakness       Communication Communication Communication: No difficulties   Cognition Arousal/Alertness: Awake/alert Behavior During Therapy: Restless, Anxious Overall Cognitive Status: Impaired/Different from baseline                                 General Comments: Pt aware she is at "a hospital," but is unable to identify. She is able to state her birthday but cannot name the current day, month, or year. She is unable to recall the name of the president of the [place]. Her conversation is largely disjointed, tangential. Pt argumentative, unhappy, and somewhat paranoid (e.g., asked pt to try to come into standing at side of pt; pt responded that she knew if she wasn't able to do this, then we would not feed her).     General Comments  Per phone conversation with pt's niece, pt fell off the toilet at home prior to coming to hospital yesterday.    Exercises Other Exercises Other Exercises: Educ w/ pt and family re: DC recs   Shoulder Instructions      Home Living Family/patient expects to be  discharged to:: Private residence Living Arrangements: Spouse/significant other Available Help at Discharge: Family;Available PRN/intermittently Type of Home: House Home Access: Stairs to enter [employer] of Steps: pt unsure   Home Layout: One level                   Additional Comments: Pt is unable to provide any details re: her home setup or PLOF. She does state that she takes care of her husband, but no additional details.      Prior Functioning/Environment               Mobility Comments: Per report for niece, pt ambulated w/o AD. ADLs Comments: Pt unable to provide clear information, but does state at one point that she drove, did the grocery shopping, cooking, and houswork        OT Problem List: Decreased strength;Decreased range of motion;Decreased activity tolerance;Impaired balance (sitting and/or standing);Decreased cognition      OT Treatment/Interventions: Self-care/ADL training;Therapeutic exercise;Patient/family education;Balance training;Energy conservation;Therapeutic activities;DME and/or AE instruction    OT Goals(Current goals can be found in the care plan section)  Acute Rehab OT Goals Patient Stated Goal: to feel better OT Goal Formulation: With patient Time For Goal Achievement: 05/24/23 Potential to Achieve Goals: Good ADL Goals Pt Will Perform Grooming: with modified independence;standing (standing at sink, for 5+ minutes, w/o LOB) Pt Will Perform Lower Body Dressing: with supervision;sit to/from stand;sitting/lateral leans Pt Will Transfer to Toilet: with supervision;regular height toilet  OT Frequency: Min 3X/week    Co-evaluation              AM-PAC OT "6 Clicks" Daily Activity     Outcome Measure Help from another person eating meals?: A Little Help from another person taking care of personal grooming?: A Lot Help from another person toileting, which includes using toliet, bedpan, or urinal?: A Lot Help from  another person bathing (including washing, rinsing, drying)?: A Lot Help from another person to put on and taking off regular upper body clothing?: A Lot Help from another person to put on and taking off regular lower body clothing?: A Lot 6 Click Score: 13   End of Session    Activity Tolerance: Treatment limited secondary to agitation Patient left: in chair;with call bell/phone within reach  OT Visit Diagnosis: Unsteadiness on feet (R26.81);Other abnormalities of gait and mobility (R26.89);Muscle weakness (generalized) (M62.81)                Time: 1610-9604 OT Time Calculation (min): 50 min Charges:  OT General Charges $OT Visit: 1 Visit OT Evaluation $OT Eval Moderate Complexity: 1 Mod OT Treatments $Self Care/Home Management : 38-52 mins Latina Craver, PhD, MS, OTR/L 05/10/23, 11:29 AM

## 2023-05-11 DIAGNOSIS — E871 Hypo-osmolality and hyponatremia: Secondary | ICD-10-CM | POA: Diagnosis not present

## 2023-05-11 LAB — BASIC METABOLIC PANEL
Anion gap: 12 (ref 5–15)
BUN: 74 mg/dL — ABNORMAL HIGH (ref 8–23)
CO2: 19 mmol/L — ABNORMAL LOW (ref 22–32)
Calcium: 9 mg/dL (ref 8.9–10.3)
Chloride: 103 mmol/L (ref 98–111)
Creatinine, Ser: 1.59 mg/dL — ABNORMAL HIGH (ref 0.44–1.00)
GFR, Estimated: 35 mL/min — ABNORMAL LOW (ref >60)
Glucose, Bld: 86 mg/dL (ref 70–99)
Potassium: 4.7 mmol/L (ref 3.5–5.1)
Sodium: 134 mmol/L — ABNORMAL LOW (ref 135–145)

## 2023-05-11 NOTE — Progress Notes (Signed)
PROGRESS NOTE    Briana Wright   ZOX:[id number] DOB: 1956/07/23  DOA: 05/09/2023 Date of Service: 05/11/23 PCP: Corky Downs, MD     Brief Narrative / Hospital Course:  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 05/16: VSS mild bradycardia. Hyponatremia w/ sodium is 130, hyperkalemia w/ potassium 5.2, chloride 100, low bicarb 15, BUN 107, serum creatinine of 2.04 (baseline 1.0), EGFR of 26, WBC 11.1, hemoglobin 13.8, platelets of 431. UA no UTI. CXR no concerns. Admitted to hospitalist service for hyponatremia and AKI.  05/17: Sodium 133. Cr 1.64, BUN 96, GFR 34. US renal normal kidneys/bladder. D/c IV fluids and encourage po intake. PT?OT recs for SNF/STR  05/18 (Sat): Cr 1.59, Na 134. Will be here thru weekend awaiting STR placement Monday, auth in progress, continue monitor labs     Consultants:  none  Procedures: none      ASSESSMENT & PLAN:   Principal Problem:   Hyponatremia Active Problems:   Hyperkalemia   AKI (acute kidney injury) (HCC)   History of left PCA stroke   Hyperlipemia   Diarrhea   Leukocytosis   Weakness  Hyponatremia - improving  Presumed secondary to GI loss and poor appetite encourage po hydration Monitor BMP   Weakness Generalized weakness along with persistent right-sided weakness post left PCA stroke in early May 2024. Per chart review, agitation and behavioral changes since stroke.  PT, OT recs for SNF/STR placement planned Monday  Fall precautions   AKI (acute kidney injury) (HCC) - improving but renal function not yet at baseline Presumed prerenal secondary to GI loss Baseline creatinine level is 1.0/eGFR 62 D/c IV fluids, encourage po intake   Leukocytosis - improving  No concerns chest x-ray, UA, procalcitonin Monitor CBC    Diarrhea - resolved Check GI panel and C. difficile panel --> needs collected   Hyperlipemia Atorvastatin 10 mg qhs resumed   History of left PCA stroke Patient on DAPT, aspirin and plavix   Hyperkalemia - resolved presumed secondary to acute kidney injury Monitor BMP    DVT prophylaxis: lovenox  Pertinent IV fluids/nutrition: d/c IV fluids today, encourage po intake and monitor labs  Central lines / invasive devices: none  Code Status: FULL CODE ACP documentation reviewed: 05/10/23 none in [facility]  Current Admission Status: inpatient  TOC needs / Dispo plan: SNF/STR Barriers to discharge / significant pending items: placement, improvement in labs/renal fxn              Subjective / Brief ROS:  Patient reports no concerns other than tired. RN reports poor appetite, needs to encourage pt for po intake / fluids  Denies CP/SOB.  Pain controlled.  Denies new weakness.  Tolerating diet.  Reports no concerns w/ urination/defecation.   Family Communication: none at this time, will update as needed, spoke w/ niece yesterday     Objective Findings:  Vitals:   05/10/23 2022 05/11/23 0409 05/11/23 0842 05/11/23 0900  BP: (!) 95/59 106/60 121/73 130/83  Pulse: 60 62 (!) 58 (!) 59  Resp: 16 16    Temp: 97.8 F (36.6 C) 97.9 F (36.6 C)    TempSrc: Oral Oral    SpO2: 100% 100% 100% 100%  Weight:      Height:       No intake or output data in the 24 hours ending  05/11/23 1107  Filed Weights   05/09/23 2020  Weight: 72.6 kg    Examination:  Physical Exam Constitutional:      General: She is not in acute distress. Cardiovascular:     Rate and Rhythm: Normal rate and regular rhythm.     Heart sounds: Normal heart sounds.  Pulmonary:     Effort: Pulmonary effort is normal.     Breath sounds: Normal breath sounds.  Abdominal:     Palpations: Abdomen is soft.  Neurological:     Mental Status: She is alert. Mental status is at baseline.  Psychiatric:         Mood and Affect: Mood normal.        Behavior: Behavior normal.        Thought Content: Thought content normal.          Scheduled Medications:   aspirin  81 mg Oral Daily   atorvastatin  10 mg Oral q1800   carvedilol  6.25 mg Oral BID WC   cloNIDine  0.1 mg Oral Q12H   clopidogrel  75 mg Oral Daily   enoxaparin (LOVENOX) injection  40 mg Subcutaneous QHS   irbesartan  300 mg Oral Daily   And   hydrochlorothiazide  25 mg Oral Daily   nicotine  7 mg Transdermal Daily   potassium chloride SA  40 mEq Oral Daily    Continuous Infusions:   PRN Medications:  acetaminophen **OR** acetaminophen, ondansetron **OR** ondansetron (ZOFRAN) IV, senna-docusate  Antimicrobials from admission:  Anti-infectives (From admission, onward)    None           Data Reviewed:  I have personally reviewed the following...  CBC: Recent Labs  Lab 05/09/23 1526 05/10/23 0503  WBC 11.1* 10.6*  HGB 13.8 12.3  HCT 43.9 38.4  MCV 84.3 82.8  PLT 431* 364   Basic Metabolic Panel: Recent Labs  Lab 05/09/23 1526 05/10/23 0503 05/11/23 0440  NA 130* 133* 134*  K 5.2* 4.4 4.7  CL 100 107 103  CO2 15* 19* 19*  GLUCOSE 97 86 86  BUN 107* 96* 74*  CREATININE 2.04* 1.64* 1.59*  CALCIUM 9.9 9.1 9.0   GFR: Estimated Creatinine Clearance: 32.8 mL/min (A) (by C-G formula based on SCr of 1.59 mg/dL (H)). Liver Function Tests: No results for input(s): "AST", "ALT", "ALKPHOS", "BILITOT", "PROT", "ALBUMIN" in the last 168 hours. No results for input(s): "LIPASE", "AMYLASE" in the last 168 hours. No results for input(s): "AMMONIA" in the last 168 hours. Coagulation Profile: No results for input(s): "INR", "PROTIME" in the last 168 hours. Cardiac Enzymes: No results for input(s): "CKTOTAL", "CKMB", "CKMBINDEX", "TROPONINI" in the last 168 hours. BNP (last 3 results) No results for input(s): "PROBNP" in the last 8760 hours. HbA1C: No results for input(s): "HGBA1C" in the last 72  hours. CBG: No results for input(s): "GLUCAP" in the last 168 hours. Lipid Profile: No results for input(s): "CHOL", "HDL", "LDLCALC", "TRIG", "CHOLHDL", "LDLDIRECT" in the last 72 hours. Thyroid Function Tests: No results for input(s): "TSH", "T4TOTAL", "FREET4", "T3FREE", "THYROIDAB" in the last 72 hours. Anemia Panel: No results for input(s): "VITAMINB12", "FOLATE", "FERRITIN", "TIBC", "IRON", "RETICCTPCT" in the last 72 hours. Most Recent Urinalysis On File:     Component Value Date/Time   COLORURINE YELLOW (A) 05/10/2023 0245   APPEARANCEUR HAZY (A) 05/10/2023 0245   LABSPEC 1.015 05/10/2023 0245   PHURINE 5.0 05/10/2023 0245   GLUCOSEU 50 (A) 05/10/2023 0245   HGBUR NEGATIVE 05/10/2023 0245  BILIRUBINUR NEGATIVE 05/10/2023 0245   KETONESUR NEGATIVE 05/10/2023 0245   PROTEINUR NEGATIVE 05/10/2023 0245   NITRITE NEGATIVE 05/10/2023 0245   LEUKOCYTESUR NEGATIVE 05/10/2023 0245   Sepsis Labs: @LABRCNTIP (procalcitonin:4,lacticidven:4) Microbiology: No results found for this or any previous visit (from the past 240 hour(s)).    Radiology Studies last 3 days: US RENAL  Result Date: 05/10/2023 CLINICAL DATA:  67 year old female with acute renal insufficiency. Hyperkalemia. EXAM: RENAL / URINARY TRACT ULTRASOUND COMPLETE COMPARISON:  None Available. FINDINGS: Right Kidney: Renal measurements: 12.7 x 6.0 x 6.3 cm = volume: 251 mL. Echogenicity within normal limits. Column of Bertin, normal variant. No mass or hydronephrosis visualized. Left Kidney: Renal measurements: 11.4 x 5.9 x 4.8 cm = volume: 168 mL. Echogenicity within normal limits. No mass or hydronephrosis visualized. Bladder: Appears normal for degree of bladder distention. No urinary debris is evident. Other: None. IMPRESSION: Normal ultrasound appearance of both kidneys and the urinary bladder. Electronically Signed   By: Odessa Fleming M.D.   On: 05/10/2023 07:11   DG Chest Port 1 View  Result Date: 05/09/2023 CLINICAL DATA:   [id number] Weakness [id number] 10026 Shortness of breath 10026 98519 Hyponatremia 40981 EXAM: PORTABLE CHEST 1 VIEW COMPARISON:  Chest x-ray 10/07/2015 FINDINGS: The heart and mediastinal contours are unchanged. Aortic calcification Right base and lingular atelectasis versus scarring. No focal consolidation. No pulmonary edema. No pleural effusion. No pneumothorax. No acute osseous abnormality. IMPRESSION: No active disease. Electronically Signed   By: Tish Frederickson M.D.   On: 05/09/2023 19:33             LOS: 2 days      Sunnie Nielsen, DO [facility] 05/11/2023, 11:07 AM    Dictation software may have been used to generate the above note. Typos may occur and escape review in typed/dictated notes. Please contact Dr Lyn Hollingshead directly for clarity if needed.  Staff may message me via secure chat in Epic  but this may not receive an immediate response,  please page me for urgent matters!  If 7PM-7AM, please contact night coverage [email or web address]

## 2023-05-12 DIAGNOSIS — E871 Hypo-osmolality and hyponatremia: Secondary | ICD-10-CM | POA: Diagnosis not present

## 2023-05-12 LAB — BASIC METABOLIC PANEL
Anion gap: 11 (ref 5–15)
BUN: 75 mg/dL — ABNORMAL HIGH (ref 8–23)
CO2: 20 mmol/L — ABNORMAL LOW (ref 22–32)
Calcium: 8.9 mg/dL (ref 8.9–10.3)
Chloride: 101 mmol/L (ref 98–111)
Creatinine, Ser: 2.29 mg/dL — ABNORMAL HIGH (ref 0.44–1.00)
GFR, Estimated: 23 mL/min — ABNORMAL LOW (ref >60)
Glucose, Bld: 91 mg/dL (ref 70–99)
Potassium: 4.4 mmol/L (ref 3.5–5.1)
Sodium: 132 mmol/L — ABNORMAL LOW (ref 135–145)

## 2023-05-12 MED ORDER — LACTATED RINGERS IV SOLN: INTRAVENOUS | Status: DC

## 2023-05-12 MED ORDER — ENOXAPARIN SODIUM 30 MG/0.3ML IJ SOSY
30.0000 mg | PREFILLED_SYRINGE | Freq: Every day | INTRAMUSCULAR | Status: DC
  Administered 2023-05-12: 30 mg via SUBCUTANEOUS
  Filled 2023-05-12: qty 0.3

## 2023-05-12 MED ORDER — SODIUM CHLORIDE 0.9 % IV SOLN: INTRAVENOUS | Status: AC

## 2023-05-12 MED ORDER — POTASSIUM CHLORIDE 20 MEQ PO PACK
40.0000 meq | PACK | Freq: Once | ORAL | Status: AC
  Administered 2023-05-12: 40 meq via ORAL
  Filled 2023-05-12: qty 2

## 2023-05-12 NOTE — Progress Notes (Signed)
Mobility Specialist - Progress Note    05/12/23 1543  Mobility  Activity Ambulated with assistance in room;Transferred to/from [facility]  Level of Assistance Minimal assist, patient does 75% or more  Assistive Device None  Distance Ambulated (ft) 4 ft  Range of Motion/Exercises Active  Activity Response Tolerated well  Mobility Referral No  $Mobility charge 1 Mobility  Mobility Specialist Start Time (ACUTE ONLY) 1522  Mobility Specialist Stop Time (ACUTE ONLY) 1543  Mobility Specialist Time Calculation (min) (ACUTE ONLY) 21 min   Pt resting in bed on RA upon entry. Pt STS and ambulates to [facility] at foot of bed MinA HHA. Pt returned to bed and left with needs in reach and bed alarm activated. RN present in room.   Johnathan Hausen Mobility Specialist 05/12/23, 3:47 PM

## 2023-05-12 NOTE — Progress Notes (Signed)
PROGRESS NOTE    Briana Wright   ZOX:[id number] DOB: 1956/04/10  DOA: 05/09/2023 Date of Service: 05/12/23 PCP: Corky Downs, MD     Brief Narrative / Hospital Course:  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 05/16: VSS mild bradycardia. Hyponatremia w/ sodium is 130, hyperkalemia w/ potassium 5.2, chloride 100, low bicarb 15, BUN 107, serum creatinine of 2.04 (baseline 1.0), EGFR of 26, WBC 11.1, hemoglobin 13.8, platelets of 431. UA no UTI. CXR no concerns. Admitted to hospitalist service for hyponatremia and AKI.  05/17: Sodium 133. Cr 1.64, BUN 96, GFR 34. US renal normal kidneys/bladder. D/c IV fluids and encourage po intake. PT?OT recs for SNF/STR  05/18 (Sat): Cr 1.59, Na 134. Will be here thru weekend awaiting STR placement Monday, auth in progress, continue monitor labs  05/19: poor po intake yesterday, Cr back up today, restart IV fluids     Consultants:  none  Procedures: none      ASSESSMENT & PLAN:   Principal Problem:   Hyponatremia Active Problems:   Hyperkalemia   AKI (acute kidney injury) (HCC)   History of left PCA stroke   Hyperlipemia   Diarrhea   Leukocytosis   Weakness  Hyponatremia - improving  Presumed secondary to GI loss and poor appetite encourage po hydration Monitor BMP   Weakness Generalized weakness along with persistent right-sided weakness post left PCA stroke in early May 2024. Per chart review, agitation and behavioral changes since stroke.  PT, OT recs for SNF/STR placement planned  Fall precautions   AKI (acute kidney injury) (HCC) - improving but renal function not yet at baseline Presumed prerenal secondary to GI loss Baseline creatinine level is 1.0/eGFR 62 D/c IV fluids, encourage po intake   Leukocytosis -  improving  No concerns chest x-ray, UA, procalcitonin Monitor CBC   Diarrhea - resolved cancel GI panel and C. difficile panel    Hyperlipemia Atorvastatin 10 mg qhs resumed   History of left PCA stroke Patient on DAPT, aspirin and plavix   Hyperkalemia - resolved presumed secondary to acute kidney injury Monitor BMP    DVT prophylaxis: lovenox  Pertinent IV fluids/nutrition: d/c IV fluids today, encourage po intake and monitor labs  Central lines / invasive devices: none  Code Status: FULL CODE ACP documentation reviewed: 05/10/23 none in VYNCA  Current Admission Status: inpatient  TOC needs / Dispo plan: SNF/STR Barriers to discharge / significant pending items: placement, improvement in labs/renal fxn           Subjective / Brief ROS:  Patient reports no concerns other than tired. RN reports poor appetite, needs to encourage pt for po intake / fluids - Pt reports she can't open bottles or pour the water pitcher herself.  Denies CP/SOB.  Pain controlled.  Denies new weakness.  Tolerating diet.  Reports no concerns w/ urination/defecation.   Family Communication: spoke w/ niece Britta Mccreedy 05/12/23 12:13 PM     Objective Findings:  Vitals:   05/11/23 1835 05/11/23 1940 05/12/23 0449 05/12/23 0758  BP: 116/67 124/82 104/74 116/67  Pulse: (!) 56 (!) 58 (!) 54 (!) 58  Resp: 16 18 18 18   Temp: 98 F (36.7 C) 97.9 F (36.6 C) 97.6 F (36.4 C) 98.1 F (36.7 C)  TempSrc: Oral Oral Oral Oral  SpO2: 99% 98% 98% 100%  Weight:  Height:        Intake/Output Summary (Last 24 hours) at 05/12/2023 1205 Last data filed at 05/12/2023 1018 Gross per 24 hour  Intake 600 ml  Output --  Net 600 ml    Filed Weights   05/09/23 2020  Weight: 72.6 kg    Examination:  Physical Exam Constitutional:      General: She is not in acute distress. Cardiovascular:     Rate and Rhythm: Normal rate and regular rhythm.     Heart sounds: Normal heart sounds.   Pulmonary:     Effort: Pulmonary effort is normal.     Breath sounds: Normal breath sounds.  Abdominal:     Palpations: Abdomen is soft.  Neurological:     Mental Status: She is alert. Mental status is at baseline.  Psychiatric:        Mood and Affect: Mood normal.        Behavior: Behavior normal.        Thought Content: Thought content normal.          Scheduled Medications:   aspirin  81 mg Oral Daily   atorvastatin  10 mg Oral q1800   carvedilol  6.25 mg Oral BID WC   cloNIDine  0.1 mg Oral Q12H   clopidogrel  75 mg Oral Daily   enoxaparin (LOVENOX) injection  40 mg Subcutaneous QHS   irbesartan  300 mg Oral Daily   And   hydrochlorothiazide  25 mg Oral Daily   nicotine  7 mg Transdermal Daily   potassium chloride  40 mEq Oral Once    Continuous Infusions:  sodium chloride 100 mL/hr at 05/12/23 0947    PRN Medications:  acetaminophen **OR** acetaminophen, ondansetron **OR** ondansetron (ZOFRAN) IV, senna-docusate  Antimicrobials from admission:  Anti-infectives (From admission, onward)    None           Data Reviewed:  I have personally reviewed the following...  CBC: Recent Labs  Lab 05/09/23 1526 05/10/23 0503  WBC 11.1* 10.6*  HGB 13.8 12.3  HCT 43.9 38.4  MCV 84.3 82.8  PLT 431* 364    Basic Metabolic Panel: Recent Labs  Lab 05/09/23 1526 05/10/23 0503 05/11/23 0440 05/12/23 0430  NA 130* 133* 134* 132*  K 5.2* 4.4 4.7 4.4  CL 100 107 103 101  CO2 15* 19* 19* 20*  GLUCOSE 97 86 86 91  BUN 107* 96* 74* 75*  CREATININE 2.04* 1.64* 1.59* 2.29*  CALCIUM 9.9 9.1 9.0 8.9    GFR: Estimated Creatinine Clearance: 22.8 mL/min (A) (by C-G formula based on SCr of 2.29 mg/dL (H)). Liver Function Tests: No results for input(s): "AST", "ALT", "ALKPHOS", "BILITOT", "PROT", "ALBUMIN" in the last 168 hours. No results for input(s): "LIPASE", "AMYLASE" in the last 168 hours. No results for input(s): "AMMONIA" in the last 168  hours. Coagulation Profile: No results for input(s): "INR", "PROTIME" in the last 168 hours. Cardiac Enzymes: No results for input(s): "CKTOTAL", "CKMB", "CKMBINDEX", "TROPONINI" in the last 168 hours. BNP (last 3 results) No results for input(s): "PROBNP" in the last 8760 hours. HbA1C: No results for input(s): "HGBA1C" in the last 72 hours. CBG: No results for input(s): "GLUCAP" in the last 168 hours. Lipid Profile: No results for input(s): "CHOL", "HDL", "LDLCALC", "TRIG", "CHOLHDL", "LDLDIRECT" in the last 72 hours. Thyroid Function Tests: No results for input(s): "TSH", "T4TOTAL", "FREET4", "T3FREE", "THYROIDAB" in the last 72 hours. Anemia Panel: No results for input(s): "VITAMINB12", "FOLATE", "FERRITIN", "TIBC", "IRON", "RETICCTPCT" in  the last 72 hours. Most Recent Urinalysis On File:     Component Value Date/Time   COLORURINE YELLOW (A) 05/10/2023 0245   APPEARANCEUR HAZY (A) 05/10/2023 0245   LABSPEC 1.015 05/10/2023 0245   PHURINE 5.0 05/10/2023 0245   GLUCOSEU 50 (A) 05/10/2023 0245   HGBUR NEGATIVE 05/10/2023 0245   BILIRUBINUR NEGATIVE 05/10/2023 0245   KETONESUR NEGATIVE 05/10/2023 0245   PROTEINUR NEGATIVE 05/10/2023 0245   NITRITE NEGATIVE 05/10/2023 0245   LEUKOCYTESUR NEGATIVE 05/10/2023 0245   Sepsis Labs: @LABRCNTIP (procalcitonin:4,lacticidven:4) Microbiology: No results found for this or any previous visit (from the past 240 hour(s)).    Radiology Studies last 3 days: US RENAL  Result Date: 05/10/2023 CLINICAL DATA:  67 year old female with acute renal insufficiency. Hyperkalemia. EXAM: RENAL / URINARY TRACT ULTRASOUND COMPLETE COMPARISON:  None Available. FINDINGS: Right Kidney: Renal measurements: 12.7 x 6.0 x 6.3 cm = volume: 251 mL. Echogenicity within normal limits. Column of Bertin, normal variant. No mass or hydronephrosis visualized. Left Kidney: Renal measurements: 11.4 x 5.9 x 4.8 cm = volume: 168 mL. Echogenicity within normal limits. No mass  or hydronephrosis visualized. Bladder: Appears normal for degree of bladder distention. No urinary debris is evident. Other: None. IMPRESSION: Normal ultrasound appearance of both kidneys and the urinary bladder. Electronically Signed   By: Odessa Fleming M.D.   On: 05/10/2023 07:11   DG Chest Port 1 View  Result Date: 05/09/2023 CLINICAL DATA:  141835 Weakness 141835 10026 Shortness of breath 10026 98519 Hyponatremia [address] EXAM: PORTABLE CHEST 1 VIEW COMPARISON:  Chest x-ray 10/07/2015 FINDINGS: The heart and mediastinal contours are unchanged. Aortic calcification Right base and lingular atelectasis versus scarring. No focal consolidation. No pulmonary edema. No pleural effusion. No pneumothorax. No acute osseous abnormality. IMPRESSION: No active disease. Electronically Signed   By: Tish Frederickson M.D.   On: 05/09/2023 19:33             LOS: 3 days      Sunnie Nielsen, DO [facility] 05/12/2023, 12:05 PM    Dictation software may have been used to generate the above note. Typos may occur and escape review in typed/dictated notes. Please contact Dr Lyn Hollingshead directly for clarity if needed.  Staff may message me via secure chat in Epic  but this may not receive an immediate response,  please page me for urgent matters!  If 7PM-7AM, please contact night coverage [email or web address]

## 2023-05-12 NOTE — Plan of Care (Signed)

## 2023-05-12 NOTE — TOC Progression Note (Addendum)
Transition of Care [facility]) - Progression Note    Patient Details  Name: Briana Wright MRN: [id number] Date of Birth: December 15, 1956  Transition of Care [facility]) CM/SW Contact  Liliana Cline, LCSW Phone Number: 05/12/2023, 9:55 AM  Clinical Narrative:    [facility] to start insurance authorization for planned DC to [facility] tomorrow. Reference #: [id number]. Clinicals are being faxed.   2:15- Call from [facility], Berkley Harvey has been approved.    Expected Discharge Plan: Skilled Nursing Facility Barriers to Discharge: Continued Medical Work up  Expected Discharge Plan and Services       Living arrangements for the past 2 months: Single Family Home                                       Social Determinants of Health (SDOH) Interventions SDOH Screenings   Food Insecurity: No Food Insecurity (05/09/2023)  Housing: Low Risk  (05/09/2023)  Transportation Needs: No Transportation Needs (05/09/2023)  Utilities: Not At Risk (05/09/2023)  Tobacco Use: High Risk (05/09/2023)    Readmission Risk Interventions     No data to display

## 2023-05-12 NOTE — Plan of Care (Signed)
  Problem: Education: Goal: Knowledge of General Education information will improve Description: Including pain rating scale, medication(s)/side effects and non-pharmacologic comfort measures Outcome: Progressing   Problem: Health Behavior/Discharge Planning: Goal: Ability to manage health-related needs will improve Outcome: Progressing   Problem: Activity: Goal: Risk for activity intolerance will decrease Outcome: Progressing   Problem: Nutrition: Goal: Adequate nutrition will be maintained Outcome: Progressing   Problem: Coping: Goal: Level of anxiety will decrease Outcome: Progressing   Problem: Pain Managment: Goal: General experience of comfort will improve Outcome: Progressing   Problem: Elimination: Goal: Will not experience complications related to bowel motility Outcome: Progressing Goal: Will not experience complications related to urinary retention Outcome: Progressing   Problem: Safety: Goal: Ability to remain free from injury will improve Outcome: Progressing

## 2023-05-13 DIAGNOSIS — E871 Hypo-osmolality and hyponatremia: Secondary | ICD-10-CM | POA: Diagnosis not present

## 2023-05-13 LAB — BASIC METABOLIC PANEL
Anion gap: 12 (ref 5–15)
BUN: 62 mg/dL — ABNORMAL HIGH (ref 8–23)
CO2: 19 mmol/L — ABNORMAL LOW (ref 22–32)
Calcium: 8.6 mg/dL — ABNORMAL LOW (ref 8.9–10.3)
Chloride: 102 mmol/L (ref 98–111)
Creatinine, Ser: 1.32 mg/dL — ABNORMAL HIGH (ref 0.44–1.00)
GFR, Estimated: 44 mL/min — ABNORMAL LOW (ref >60)
Glucose, Bld: 87 mg/dL (ref 70–99)
Potassium: 3.7 mmol/L (ref 3.5–5.1)
Sodium: 133 mmol/L — ABNORMAL LOW (ref 135–145)

## 2023-05-13 MED ORDER — ACETAMINOPHEN 325 MG PO TABS: 650.0000 mg | ORAL_TABLET | Freq: Four times a day (QID) | ORAL | Status: AC | PRN

## 2023-05-13 MED ORDER — ATORVASTATIN CALCIUM 10 MG PO TABS: 80.0000 mg | ORAL_TABLET | Freq: Every evening | ORAL | 0 refills | Status: DC

## 2023-05-13 MED ORDER — SENNOSIDES-DOCUSATE SODIUM 8.6-50 MG PO TABS: 1.0000 | ORAL_TABLET | Freq: Every evening | ORAL | Status: AC | PRN

## 2023-05-13 NOTE — Discharge Summary (Addendum)
Physician Discharge Summary   [name] MRN: [id number]  DOB: 26-May-1956   Admit:     Date of Admission: 05/09/2023 Admitted from: home   Discharge: Date of discharge: 05/13/23 Disposition: Skilled nursing facility Condition at discharge: good  CODE STATUS: FULL CODE      Discharge Physician: Sunnie Nielsen, DO [facility]     PCP: Corky Downs, MD  Recommendations for Outpatient Follow-up:  Follow up with PCP Corky Downs, MD in 1-2 weeks Please obtain labs/tests: BMP at facility in 203 days, would recommend check again in 1-2 weeks pending results  Monitor BP - note changes to nephrotoxic medications  Please follow up on the following pending results: none PCP AND OTHER OUTPATIENT PROVIDERS: SEE BELOW FOR SPECIFIC DISCHARGE INSTRUCTIONS PRINTED FOR PATIENT IN ADDITION TO GENERIC AVS PATIENT INFO    Discharge Instructions     Diet general   Complete by: As directed    Discharge instructions   Complete by: As directed    Advise hydration po goal 1.5 liters/day Check BMP in 2-3 days from admission   Increase activity slowly   Complete by: As directed          Discharge Diagnoses: Principal Problem:   Hyponatremia Active Problems:   Hyperkalemia   AKI (acute kidney injury) (HCC)   History of left PCA stroke   Hyperlipemia   Diarrhea   Leukocytosis   Weakness       Hospital Course: 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 05/16: VSS mild bradycardia. Hyponatremia w/ sodium is 130, hyperkalemia w/ potassium 5.2, chloride 100, low bicarb 15, BUN 107, serum creatinine of 2.04 (baseline 1.0), EGFR of 26, WBC 11.1, hemoglobin 13.8, platelets of 431. UA no UTI. CXR no concerns. Admitted to hospitalist service for hyponatremia  and AKI.  05/17: Sodium 133. Cr 1.64, BUN 96, GFR 34. US renal normal kidneys/bladder. D/c IV fluids and encourage po intake. PT?OT recs for SNF/STR  05/18 (Sat): Cr 1.59, Na 134. Will be here thru weekend awaiting STR placement Monday, auth in progress, continue monitor labs  05/19: poor po intake yesterday, Cr back up today 2.29, restart IV fluids x1L 05/20: Cr improved to 1.32    Consultants:  none  Procedures: none      ASSESSMENT & PLAN:   Principal Problem:   Hyponatremia Active Problems:   Hyperkalemia   AKI (acute kidney injury) (HCC)   History of left PCA stroke   Hyperlipemia   Diarrhea   Leukocytosis   Weakness  Hyponatremia - improving  Presumed secondary to GI loss and poor appetite encourage po hydration Monitor BMP   Weakness Generalized weakness along with persistent right-sided weakness post left PCA stroke in early May 2024. Per chart review, agitation and behavioral changes since stroke.  PT, OT recs for SNF/STR placement planned  Fall precautions   AKI (acute kidney injury) (HCC) - improving but renal function not yet at baseline Presumed prerenal secondary to GI loss Baseline creatinine level is 1.0/eGFR 62 D/c IV fluids, encourage po intake   Leukocytosis - improving  No concerns chest x-ray, UA, procalcitonin Monitor CBC   Diarrhea - resolved cancel GI panel and C. difficile panel    Hyperlipemia Atorvastatin 10 mg qhs resumed   History of left PCA stroke Patient on DAPT, aspirin and  plavix   Hyperkalemia - resolved presumed secondary to acute kidney injury Monitor BMP  HTN Holding nephrotoxic BP meds (ACE/ARB, diuretic)  Caution w/ beta blocker d/t low HR  Consider add amlodipine     DVT prophylaxis: lovenox  Pertinent IV fluids/nutrition: d/c IV fluids today, encourage po intake and monitor labs  Central lines / invasive devices: none  Code Status: FULL CODE ACP documentation reviewed: 05/10/23 none in  VYNCA  Current Admission Status: inpatient  TOC needs / Dispo plan: SNF/STR Barriers to discharge / significant pending items: placement, improvement in labs/renal fxn             Discharge Instructions  Allergies as of 05/13/2023   No Known Allergies      Medication List     STOP taking these medications    ibuprofen 200 MG tablet Commonly known as: ADVIL   metoprolol tartrate 50 MG tablet Commonly known as: LOPRESSOR   olmesartan-hydrochlorothiazide 40-25 MG tablet Commonly known as: BENICAR HCT   potassium chloride SA 20 MEQ tablet Commonly known as: KLOR-CON M       TAKE these medications    acetaminophen 325 MG tablet Commonly known as: TYLENOL Take 2 tablets (650 mg total) by mouth every 6 (six) hours as needed for mild pain, fever or headache (or Fever >/= 101).   aspirin 81 MG chewable tablet Chew 81 mg by mouth daily.   atorvastatin 10 MG tablet Commonly known as: LIPITOR Take 8 tablets (80 mg total) by mouth at bedtime.   carvedilol 6.25 MG tablet Commonly known as: COREG Take 6.25 mg by mouth 2 (two) times daily with a meal.   cloNIDine 0.1 MG tablet Commonly known as: CATAPRES Take 0.1 mg by mouth every 12 (twelve) hours.   clopidogrel 75 MG tablet Commonly known as: PLAVIX Take 75 mg by mouth daily.   nicotine 7 mg/24hr patch Commonly known as: Nicoderm CQ Place 1 patch (7 mg total) onto the skin daily.   senna-docusate 8.6-50 MG tablet Commonly known as: Senokot-S Take 1 tablet by mouth at bedtime as needed for mild constipation.         Contact information for after-discharge care     Destination     [facility] .   Service: Skilled Nursing Contact information: [address] [place] [address] [phone]                     No Known Allergies   Subjective: pt feeling well today, reports good energy   Discharge Exam: BP 106/68 (BP Location: Left Arm)    Pulse (!) 56   Temp 97.8 F (36.6 C) (Oral)   Resp 18   Ht 5' 2.99" (1.6 m)   Wt 72.6 kg   SpO2 100%   BMI 28.35 kg/m  General: Pt is alert, awake, not in acute distress Cardiovascular: RRR, S1/S2 +, no rubs, no gallops Respiratory: CTA bilaterally, no wheezing, no rhonchi Abdominal: Soft, NT, ND, bowel sounds  Extremities: no edema, no cyanosis     The results of significant diagnostics from this hospitalization (including imaging, microbiology, ancillary and laboratory) are listed below for reference.     Microbiology: No results found for this or any previous visit (from the past 240 hour(s)).   Labs: BNP (last 3 results) No results for input(s): "BNP" in the last 8760 hours. Basic Metabolic Panel: Recent Labs  Lab 05/09/23 1526 05/10/23 0503 05/11/23 0440 05/12/23 0430 05/13/23 9147  NA 130* 133* 134* 132* 133*  K 5.2* 4.4 4.7 4.4 3.7  CL 100 107 103 101 102  CO2 15* 19* 19* 20* 19*  GLUCOSE 97 86 86 91 87  BUN 107* 96* 74* 75* 62*  CREATININE 2.04* 1.64* 1.59* 2.29* 1.32*  CALCIUM 9.9 9.1 9.0 8.9 8.6*   Liver Function Tests: No results for input(s): "AST", "ALT", "ALKPHOS", "BILITOT", "PROT", "ALBUMIN" in the last 168 hours. No results for input(s): "LIPASE", "AMYLASE" in the last 168 hours. No results for input(s): "AMMONIA" in the last 168 hours. CBC: Recent Labs  Lab 05/09/23 1526 05/10/23 0503  WBC 11.1* 10.6*  HGB 13.8 12.3  HCT 43.9 38.4  MCV 84.3 82.8  PLT 431* 364   Cardiac Enzymes: No results for input(s): "CKTOTAL", "CKMB", "CKMBINDEX", "TROPONINI" in the last 168 hours. BNP: Invalid input(s): "POCBNP" CBG: No results for input(s): "GLUCAP" in the last 168 hours. D-Dimer No results for input(s): "DDIMER" in the last 72 hours. Hgb A1c No results for input(s): "HGBA1C" in the last 72 hours. Lipid Profile No results for input(s): "CHOL", "HDL", "LDLCALC", "TRIG", "CHOLHDL", "LDLDIRECT" in the last 72 hours. Thyroid function  studies No results for input(s): "TSH", "T4TOTAL", "T3FREE", "THYROIDAB" in the last 72 hours.  Invalid input(s): "FREET3" Anemia work up No results for input(s): "VITAMINB12", "FOLATE", "FERRITIN", "TIBC", "IRON", "RETICCTPCT" in the last 72 hours. Urinalysis    Component Value Date/Time   COLORURINE YELLOW (A) 05/10/2023 0245   APPEARANCEUR HAZY (A) 05/10/2023 0245   LABSPEC 1.015 05/10/2023 0245   PHURINE 5.0 05/10/2023 0245   GLUCOSEU 50 (A) 05/10/2023 0245   HGBUR NEGATIVE 05/10/2023 0245   BILIRUBINUR NEGATIVE 05/10/2023 0245   KETONESUR NEGATIVE 05/10/2023 0245   PROTEINUR NEGATIVE 05/10/2023 0245   NITRITE NEGATIVE 05/10/2023 0245   LEUKOCYTESUR NEGATIVE 05/10/2023 0245   Sepsis Labs Recent Labs  Lab 05/09/23 1526 05/10/23 0503  WBC 11.1* 10.6*   Microbiology No results found for this or any previous visit (from the past 240 hour(s)). Imaging US RENAL  Result Date: 05/10/2023 CLINICAL DATA:  67 year old female with acute renal insufficiency. Hyperkalemia. EXAM: RENAL / URINARY TRACT ULTRASOUND COMPLETE COMPARISON:  None Available. FINDINGS: Right Kidney: Renal measurements: 12.7 x 6.0 x 6.3 cm = volume: 251 mL. Echogenicity within normal limits. Column of Bertin, normal variant. No mass or hydronephrosis visualized. Left Kidney: Renal measurements: 11.4 x 5.9 x 4.8 cm = volume: 168 mL. Echogenicity within normal limits. No mass or hydronephrosis visualized. Bladder: Appears normal for degree of bladder distention. No urinary debris is evident. Other: None. IMPRESSION: Normal ultrasound appearance of both kidneys and the urinary bladder. Electronically Signed   By: Odessa Fleming M.D.   On: 05/10/2023 07:11   DG Chest Port 1 View  Result Date: 05/09/2023 CLINICAL DATA:  141835 Weakness 141835 10026 Shortness of breath 10026 98519 Hyponatremia [address] EXAM: PORTABLE CHEST 1 VIEW COMPARISON:  Chest x-ray 10/07/2015 FINDINGS: The heart and mediastinal contours are unchanged. Aortic  calcification Right base and lingular atelectasis versus scarring. No focal consolidation. No pulmonary edema. No pleural effusion. No pneumothorax. No acute osseous abnormality. IMPRESSION: No active disease. Electronically Signed   By: Tish Frederickson M.D.   On: 05/09/2023 19:33      Time coordinating discharge: over 30 minutes  SIGNED:  Sunnie Nielsen DO [facility]

## 2023-05-13 NOTE — Plan of Care (Signed)
IV removed, report called to [facility], niece has been updated and will meet patient at facility.  Patient to be transported by EMS

## 2023-05-13 NOTE — Care Management Important Message (Signed)
Important Message  Patient Details  Name: Briana Wright MRN: [id number] Date of Birth: 1956-10-03   Medicare Important Message Given:  Yes     Johnell Comings 05/13/2023, 11:31 AM

## 2023-05-13 NOTE — TOC Progression Note (Signed)
Transition of Care [facility]) - Progression Note    Patient Details  Name: Briana Wright MRN: [id number] Date of Birth: 18-Feb-1956  [facility]) CM/SW Contact  Chapman Fitch, RN Phone Number: 05/13/2023, 10:08 AM  Clinical Narrative:    Confirmed auth approved for [facility] Per MD patient is medically ready for discharge Message left for Ricky at [facility] to confirm patient can admit today    Expected Discharge Plan: Skilled Nursing Facility Barriers to Discharge: Continued Medical Work up  Expected Discharge Plan and Services       Living arrangements for the past 2 months: Single Family Home                                       Social Determinants of Health (SDOH) Interventions SDOH Screenings   Food Insecurity: No Food Insecurity (05/09/2023)  Housing: Low Risk  (05/09/2023)  Transportation Needs: No Transportation Needs (05/09/2023)  Utilities: Not At Risk (05/09/2023)  Tobacco Use: High Risk (05/09/2023)    Readmission Risk Interventions     No data to display

## 2023-05-13 NOTE — TOC Transition Note (Signed)
Transition of Care [facility]) - CM/SW Discharge Note   Patient Details  Name: Briana Wright MRN: [id number] Date of Birth: 02-14-1956  Transition [facility] Care [facility]) CM/SW Contact:  Chapman Fitch, RN Phone Number: 05/13/2023, 12:10 PM   Clinical Narrative:      Patient will DC ON:[id number] Anticipated DC date: 05/13/23  Family notified: Niece Britta Mccreedy per patient's request Transport XL:[id number]  Per MD patient ready for DC to . RN, patient, patient's family, and facility notified of DC. Discharge Summary sent to facility. RN given number for report. DC packet on chart. Ambulance transport requested for patient.  TOC signing off.  Bevelyn Ngo [facility] [phone]    Barriers to Discharge: Continued Medical Work up   Patient Goals and CMS Choice CMS Medicare.gov Compare Post Acute Care list provided to:: Patient Represenative (must comment) Choice offered to / list presented to : NA  Discharge Placement                         Discharge Plan and Services Additional resources added to the After Visit Summary for                                       Social Determinants of Health (SDOH) Interventions SDOH Screenings   Food Insecurity: No Food Insecurity (05/09/2023)  Housing: Low Risk  (05/09/2023)  Transportation Needs: No Transportation Needs (05/09/2023)  Utilities: Not At Risk (05/09/2023)  Tobacco Use: High Risk (05/09/2023)     Readmission Risk Interventions     No data to display

## 2023-05-29 ENCOUNTER — Emergency Department: Payer: Medicare Other

## 2023-05-29 ENCOUNTER — Inpatient Hospital Stay
Admission: EM | Admit: 2023-05-29 | Discharge: 2023-06-01 | DRG: 391 | Disposition: A | Discharge status: Home / Self Care | Payer: Medicare Other | Attending: Internal Medicine | Admitting: Internal Medicine

## 2023-05-29 ENCOUNTER — Other Ambulatory Visit: Payer: Self-pay

## 2023-05-29 DIAGNOSIS — H53461 Homonymous bilateral field defects, right side: Secondary | ICD-10-CM | POA: Diagnosis present

## 2023-05-29 DIAGNOSIS — I69320 Aphasia following cerebral infarction: Secondary | ICD-10-CM

## 2023-05-29 DIAGNOSIS — E785 Hyperlipidemia, unspecified: Secondary | ICD-10-CM | POA: Diagnosis present

## 2023-05-29 DIAGNOSIS — R748 Abnormal levels of other serum enzymes: Secondary | ICD-10-CM | POA: Diagnosis not present

## 2023-05-29 DIAGNOSIS — I10 Essential (primary) hypertension: Secondary | ICD-10-CM | POA: Insufficient documentation

## 2023-05-29 DIAGNOSIS — R112 Nausea with vomiting, unspecified: Secondary | ICD-10-CM | POA: Diagnosis present

## 2023-05-29 DIAGNOSIS — Z8673 Personal history of transient ischemic attack (TIA), and cerebral infarction without residual deficits: Secondary | ICD-10-CM

## 2023-05-29 DIAGNOSIS — I69398 Other sequelae of cerebral infarction: Secondary | ICD-10-CM

## 2023-05-29 DIAGNOSIS — E876 Hypokalemia: Secondary | ICD-10-CM | POA: Diagnosis present

## 2023-05-29 DIAGNOSIS — F17200 Nicotine dependence, unspecified, uncomplicated: Secondary | ICD-10-CM | POA: Diagnosis present

## 2023-05-29 DIAGNOSIS — Z7982 Long term (current) use of aspirin: Secondary | ICD-10-CM

## 2023-05-29 DIAGNOSIS — R7401 Elevation of levels of liver transaminase levels: Secondary | ICD-10-CM | POA: Insufficient documentation

## 2023-05-29 DIAGNOSIS — K2970 Gastritis, unspecified, without bleeding: Principal | ICD-10-CM | POA: Diagnosis present

## 2023-05-29 DIAGNOSIS — R7989 Other specified abnormal findings of blood chemistry: Secondary | ICD-10-CM

## 2023-05-29 DIAGNOSIS — Z79899 Other long term (current) drug therapy: Secondary | ICD-10-CM

## 2023-05-29 DIAGNOSIS — G9341 Metabolic encephalopathy: Secondary | ICD-10-CM | POA: Diagnosis not present

## 2023-05-29 DIAGNOSIS — E871 Hypo-osmolality and hyponatremia: Secondary | ICD-10-CM | POA: Diagnosis present

## 2023-05-29 DIAGNOSIS — R17 Unspecified jaundice: Secondary | ICD-10-CM | POA: Diagnosis present

## 2023-05-29 DIAGNOSIS — I7122 Aneurysm of the aortic arch, without rupture: Secondary | ICD-10-CM | POA: Diagnosis present

## 2023-05-29 DIAGNOSIS — Z90710 Acquired absence of both cervix and uterus: Secondary | ICD-10-CM

## 2023-05-29 HISTORY — DX: Aneurysm of the aortic arch, without rupture: I71.22

## 2023-05-29 LAB — CBC
HCT: 39.5 % (ref 36.0–46.0)
Hemoglobin: 12.8 g/dL (ref 12.0–15.0)
MCH: 26.3 pg (ref 26.0–34.0)
MCHC: 32.4 g/dL (ref 30.0–36.0)
MCV: 81.1 fL (ref 80.0–100.0)
Platelets: 357 10*3/uL (ref 150–400)
RBC: 4.87 MIL/uL (ref 3.87–5.11)
RDW: 15.1 % (ref 11.5–15.5)
WBC: 8.5 10*3/uL (ref 4.0–10.5)
nRBC: 0 % (ref 0.0–0.2)

## 2023-05-29 LAB — COMPREHENSIVE METABOLIC PANEL
ALT: 157 U/L — ABNORMAL HIGH (ref 0–44)
AST: 157 U/L — ABNORMAL HIGH (ref 15–41)
Albumin: 3.3 g/dL — ABNORMAL LOW (ref 3.5–5.0)
Alkaline Phosphatase: 447 U/L — ABNORMAL HIGH (ref 38–126)
Anion gap: 13 (ref 5–15)
BUN: 18 mg/dL (ref 8–23)
CO2: 26 mmol/L (ref 22–32)
Calcium: 8.9 mg/dL (ref 8.9–10.3)
Chloride: 91 mmol/L — ABNORMAL LOW (ref 98–111)
Creatinine, Ser: 0.93 mg/dL (ref 0.44–1.00)
GFR, Estimated: >60 mL/min (ref >60)
Glucose, Bld: 108 mg/dL — ABNORMAL HIGH (ref 70–99)
Potassium: 4.3 mmol/L (ref 3.5–5.1)
Sodium: 130 mmol/L — ABNORMAL LOW (ref 135–145)
Total Bilirubin: 2.2 mg/dL — ABNORMAL HIGH (ref 0.3–1.2)
Total Protein: 8 g/dL (ref 6.5–8.1)

## 2023-05-29 LAB — HEPATIC FUNCTION PANEL
ALT: 142 U/L — ABNORMAL HIGH (ref 0–44)
AST: 131 U/L — ABNORMAL HIGH (ref 15–41)
Albumin: 3.3 g/dL — ABNORMAL LOW (ref 3.5–5.0)
Alkaline Phosphatase: 420 U/L — ABNORMAL HIGH (ref 38–126)
Bilirubin, Direct: 0.3 mg/dL — ABNORMAL HIGH (ref 0.0–0.2)
Indirect Bilirubin: 1.1 mg/dL — ABNORMAL HIGH (ref 0.3–0.9)
Total Bilirubin: 1.4 mg/dL — ABNORMAL HIGH (ref 0.3–1.2)
Total Protein: 8 g/dL (ref 6.5–8.1)

## 2023-05-29 LAB — HEPATITIS PANEL, ACUTE
HCV Ab: NONREACTIVE
Hep A IgM: NONREACTIVE
Hep B C IgM: NONREACTIVE
Hepatitis B Surface Ag: NONREACTIVE

## 2023-05-29 LAB — LIPASE, BLOOD: Lipase: 42 U/L (ref 11–51)

## 2023-05-29 LAB — GAMMA GT: GGT: 153 U/L — ABNORMAL HIGH (ref 7–50)

## 2023-05-29 LAB — HIV ANTIBODY (ROUTINE TESTING W REFLEX): HIV Screen 4th Generation wRfx: NONREACTIVE

## 2023-05-29 LAB — CK: Total CK: 281 U/L — ABNORMAL HIGH (ref 38–234)

## 2023-05-29 MED ORDER — ONDANSETRON HCL 4 MG/2ML IJ SOLN
4.0000 mg | Freq: Once | INTRAMUSCULAR | Status: AC
  Administered 2023-05-29: 4 mg via INTRAVENOUS
  Filled 2023-05-29: qty 2

## 2023-05-29 MED ORDER — ONDANSETRON HCL 4 MG PO TABS
4.0000 mg | ORAL_TABLET | Freq: Four times a day (QID) | ORAL | Status: DC | PRN
  Administered 2023-05-30 – 2023-05-31 (×2): 4 mg via ORAL
  Filled 2023-05-29 (×2): qty 1

## 2023-05-29 MED ORDER — SUCRALFATE 1 G PO TABS: 1.0000 g | ORAL_TABLET | Freq: Four times a day (QID) | ORAL | 0 refills | Status: DC

## 2023-05-29 MED ORDER — PANTOPRAZOLE SODIUM 40 MG IV SOLR
40.0000 mg | Freq: Once | INTRAVENOUS | Status: AC
  Administered 2023-05-29: 40 mg via INTRAVENOUS
  Filled 2023-05-29: qty 10

## 2023-05-29 MED ORDER — SODIUM CHLORIDE 0.9 % IV SOLN: INTRAVENOUS | Status: DC

## 2023-05-29 MED ORDER — ENOXAPARIN SODIUM 40 MG/0.4ML IJ SOSY
40.0000 mg | PREFILLED_SYRINGE | INTRAMUSCULAR | Status: DC
  Administered 2023-05-29: 40 mg via SUBCUTANEOUS
  Filled 2023-05-29: qty 0.4

## 2023-05-29 MED ORDER — PANTOPRAZOLE SODIUM 40 MG PO TBEC: 40.0000 mg | DELAYED_RELEASE_TABLET | Freq: Every day | ORAL | 1 refills | Status: DC

## 2023-05-29 MED ORDER — METOCLOPRAMIDE HCL 5 MG/ML IJ SOLN
10.0000 mg | Freq: Once | INTRAMUSCULAR | Status: AC
  Administered 2023-05-29: 10 mg via INTRAVENOUS
  Filled 2023-05-29: qty 2

## 2023-05-29 MED ORDER — ONDANSETRON HCL 4 MG/2ML IJ SOLN
4.0000 mg | Freq: Four times a day (QID) | INTRAMUSCULAR | Status: DC | PRN
  Administered 2023-05-29 – 2023-05-30 (×3): 4 mg via INTRAVENOUS
  Filled 2023-05-29 (×3): qty 2

## 2023-05-29 MED ORDER — PANTOPRAZOLE SODIUM 40 MG IV SOLR
40.0000 mg | Freq: Two times a day (BID) | INTRAVENOUS | Status: DC
  Administered 2023-05-29 – 2023-06-01 (×6): 40 mg via INTRAVENOUS
  Filled 2023-05-29 (×6): qty 10

## 2023-05-29 MED ORDER — SODIUM CHLORIDE 0.9 % IV BOLUS
500.0000 mL | Freq: Once | INTRAVENOUS | Status: AC
  Administered 2023-05-29: 500 mL via INTRAVENOUS

## 2023-05-29 MED ORDER — ONDANSETRON HCL 4 MG/2ML IJ SOLN
4.0000 mg | INTRAMUSCULAR | Status: AC
  Administered 2023-05-29: 4 mg via INTRAVENOUS
  Filled 2023-05-29: qty 2

## 2023-05-29 MED ORDER — PANTOPRAZOLE SODIUM 40 MG IV SOLR
40.0000 mg | Freq: Two times a day (BID) | INTRAVENOUS | Status: DC
  Filled 2023-05-29: qty 10

## 2023-05-29 MED ORDER — IOHEXOL 300 MG/ML  SOLN
100.0000 mL | Freq: Once | INTRAMUSCULAR | Status: AC | PRN
  Administered 2023-05-29: 100 mL via INTRAVENOUS

## 2023-05-29 NOTE — ED Notes (Signed)
Pt feeling much better after nausea med. No episodes of emesis or dry heaves since admin.

## 2023-05-29 NOTE — ED Triage Notes (Addendum)
Patient arriving via EMS from [facility]. Patient reportedly had a stroke last week with right-sided deficit, C/O nausea and vomiting that began last week. Denies any fevers, diarrhea, or abdominal pain. Patient states she last had a bowel movement two days ago.

## 2023-05-29 NOTE — Assessment & Plan Note (Signed)
BP stable Titrate home regimen 

## 2023-05-29 NOTE — ED Notes (Signed)
US at bedside

## 2023-05-29 NOTE — ED Provider Notes (Addendum)
-----------------------------------------   8:28 AM on 05/29/2023 ----------------------------------------- Patient care assumed from Dr. York Cerise.  Patient's liver function tests are elevated however had hemolyzed.  I reviewed the patient's care everywhere chart last months she had normal LFTs on her chemistry.  CBC is reassuring ultrasound shows no concerning findings.  Patient and family member state the patient's nausea and intermittent upper abdominal discomfort have been ongoing for approximately 1 month.  I suspect this could also be gastritis related.  CT is negative for acute abnormality.  Given the reassuring CT reassuring ultrasound otherwise reassuring labs besides LFTs we will discharge with GI follow-up.  Will place the patient on Protonix as well as sucralfate until she is able to see GI.  Patient agreeable to plan.   Minna Antis, MD 05/29/23 1036  ----------------------------------------- 12:51 PM on 05/29/2023 ----------------------------------------- Patient has continued to be nauseated now has vomited once again.  Given Zofran and the patient remains nauseated and still continues to dry heave with intermittent vomiting.  Patient has received Reglan still feels nauseated.  Will admit to the hospital service for intractable nausea vomiting.  Still suspect more of a gastritis picture.    Minna Antis, MD 05/29/23 1252

## 2023-05-29 NOTE — Discharge Instructions (Addendum)
Protonix each morning for the next 2 months.  Please take your prescribed sucralfate tablets before breakfast lunch dinner and before going to bed for the next 2 weeks.  Please call the number provided for GI medicine to arrange a follow-up appointment.  Please have your liver function test rechecked in 1 to 2 weeks.  Return to the emergency department for any symptom concerning to yourself.

## 2023-05-29 NOTE — Assessment & Plan Note (Signed)
Recurrent nausea and vomiting over several days Noted transaminitis with AST and ALT in the 150s, ALP in the 400 with T. bili around 2 CT abdomen pelvis as well as right upper quadrant ultrasound negative for any acute biliary issues IV Protonix for gastritis coverage Will also reach out to gastroenterology for further recommendations given transaminitis with elevated alk phos and mildly elevated T. bili Follow

## 2023-05-29 NOTE — Assessment & Plan Note (Addendum)
Recently on course of DAPT Now on baby aspirin Continue

## 2023-05-29 NOTE — Assessment & Plan Note (Signed)
AST 130s to 150s ALT 140s to 150s Alk phos in the 400s T bilii 2.2-->1.4  CT abdomen pelvis as well as right upper quadrant ultrasound grossly stable. Case discussed with Dr. Tobi Bastos as a curbside Will check GGT to correlate Formal GI consult for MRCP if GGT elevated Follow

## 2023-05-29 NOTE — Assessment & Plan Note (Signed)
Continue statin. 

## 2023-05-29 NOTE — H&P (Signed)
History and Physical    [name] ZOX:[id number] DOB: February 24, 1956 DOA: 05/29/2023 DOS: the patient was seen and examined on 05/29/2023 PCP: Corky Downs, MD  Patient coming from: SNF  Chief Complaint:  Chief Complaint  Patient presents with   Emesis   HPI: Briana Wright is a 68 y.o. female with medical history significant of hypertension, hyperlipidemia, CVA presenting with intractable nausea and vomiting, transaminitis.  Patient noted to have been recently admitted May 16 through May 20 for issues including hypokalemia in setting of decreased p.o. intake, AKI recent CVA status post DAPT treatment.  History primarily from patient's niece at the bedside.  Per report, patient with recurring episodes of nausea and vomiting over multiple days.  Has been unable to tolerate any solid or liquid intake.  Emesis nonbloody nonbilious.  Mild generalized abdominal pain.  No fevers or chills.  No nausea or vomiting.  No chest pain or shortness of breath.  Has been somewhat able to tolerate baby aspirin as well as oral medication regimen.  No recent antibiotic use.  No reported heavy NSAID use.  No reported diarrhea. Presented to the ER afebrile, hemodynamically stable.  White count 8.5, hemoglobin 12.8, platelets 357, creatinine 0.93, AST 157, ALT 157, alk phos 447, T. bili 2.2.  CT of the abdomen pelvis as well as right upper quadrant ultrasound grossly stable. Review of Systems: As mentioned in the history of present illness. All other systems reviewed and are negative. Past Medical History:  Diagnosis Date   Hypertension    Stroke [facility])    Past Surgical History:  Procedure Laterality Date   ABDOMINAL HYSTERECTOMY     Social History:  reports that she has been smoking. She does not have any smokeless tobacco history on file. She reports that she does not drink alcohol and does not use drugs.  No Known Allergies  Family History  Family history unknown: Yes    Prior to Admission  medications   Medication Sig Start Date End Date Taking? Authorizing Provider  pantoprazole (PROTONIX) 40 MG tablet Take 1 tablet (40 mg total) by mouth daily. 05/29/23 05/28/24 Yes Minna Antis, MD  sucralfate (CARAFATE) 1 g tablet Take 1 tablet (1 g total) by mouth 4 (four) times daily for 15 days. 05/29/23 06/13/23 Yes Minna Antis, MD  acetaminophen (TYLENOL) 325 MG tablet Take 2 tablets (650 mg total) by mouth every 6 (six) hours as needed for mild pain, fever or headache (or Fever >/= 101). 05/13/23   Sunnie Nielsen, DO  aspirin 81 MG chewable tablet Chew 81 mg by mouth daily. 11/02/15   [provider]  atorvastatin (LIPITOR) 10 MG tablet Take 8 tablets (80 mg total) by mouth at bedtime. 05/13/23   Sunnie Nielsen, DO  carvedilol (COREG) 6.25 MG tablet Take 6.25 mg by mouth 2 (two) times daily with a meal. 06/19/21   [provider]  cloNIDine (CATAPRES) 0.1 MG tablet Take 0.1 mg by mouth every 12 (twelve) hours. 04/30/23 04/29/24  [provider]  nicotine (NICODERM CQ) 7 mg/24hr patch Place 1 patch (7 mg total) onto the skin daily. Patient not taking: Reported on 05/09/2023 10/08/15   Adrian Saran, MD  senna-docusate (SENOKOT-S) 8.6-50 MG tablet Take 1 tablet by mouth at bedtime as needed for mild constipation. 05/13/23   Sunnie Nielsen, DO    Physical Exam: Vitals:   05/29/23 1000 05/29/23 1100 05/29/23 1130 05/29/23 1444  BP: 125/79 132/81 (!) 143/93 (!) 148/94  Pulse: 68 62 74 63  Resp:  18  18 14   Temp:   98.3 F (36.8 C) 98.1 F (36.7 C)  TempSrc:   Oral   SpO2: 94% 97% 97% 98%  Weight:      Height:       Physical Exam Constitutional:      Appearance: She is normal weight.  HENT:     Head: Normocephalic.     Nose: Nose normal.     Mouth/Throat:     Mouth: Mucous membranes are moist.  Eyes:     Pupils: Pupils are equal, round, and reactive to light.  Cardiovascular:     Rate and Rhythm: Normal rate and regular rhythm.  Pulmonary:      Effort: Pulmonary effort is normal.  Abdominal:     General: Abdomen is flat.  Musculoskeletal:        General: Normal range of motion.  Skin:    General: Skin is dry.  Neurological:     General: No focal deficit present.     Comments: + slowed mentation s/p CVA  Nonfocal neuro exam    Psychiatric:        Mood and Affect: Mood normal.     Data Reviewed:  There are no new results to review at this time. CT ABDOMEN PELVIS W CONTRAST CLINICAL DATA:  Abdominal pain  EXAM: CT ABDOMEN AND PELVIS WITH CONTRAST  TECHNIQUE: Multidetector CT imaging of the abdomen and pelvis was performed using the standard protocol following bolus administration of intravenous contrast.  RADIATION DOSE REDUCTION: This exam was performed according to the departmental dose-optimization program which includes automated exposure control, adjustment of the mA and/or kV according to patient size and/or use of iterative reconstruction technique.  CONTRAST:  OMNIPAQUE IOHEXOL 300 MG/ML  SOLN  COMPARISON:  None Available.  FINDINGS: Lower chest: No acute abnormality.  Hepatobiliary: No focal liver abnormality is seen. No gallstones, gallbladder wall thickening, or biliary dilatation.  Pancreas: Unremarkable. No pancreatic ductal dilatation or surrounding inflammatory changes.  Spleen: Normal in size without focal abnormality.  Adrenals/Urinary Tract: Bilateral adrenal glands are unremarkable. No hydronephrosis or nephrolithiasis. Small bilateral low-attenuation renal lesions which are too small to accurately characterize but likely simple cysts, no specific follow-up imaging is recommended. Bladder is unremarkable.  Stomach/Bowel: Stomach is within normal limits. No evidence of bowel wall thickening, distention, or inflammatory changes.  Vascular/Lymphatic: Aortic atherosclerosis. No enlarged abdominal or pelvic lymph nodes.  Reproductive: No adnexal masses.  Other: No abdominal  wall hernia or abnormality. No abdominopelvic ascites.  Musculoskeletal: No acute or significant osseous findings.  IMPRESSION: 1. No acute findings in the abdomen or pelvis. 2.  Aortic Atherosclerosis (ICD10-I70.0).  Electronically Signed   By: Allegra Lai M.D.   On: 05/29/2023 10:15 US ABDOMEN LIMITED RUQ (LIVER/GB) CLINICAL DATA:  Right upper quadrant tenderness with nausea  EXAM: ULTRASOUND ABDOMEN LIMITED RIGHT UPPER QUADRANT  COMPARISON:  None Available.  FINDINGS: Gallbladder:  No gallstones or wall thickening visualized. No sonographic Murphy sign noted by sonographer.  Common bile duct:  Diameter: 5 mm  Liver:  No focal lesion identified. Within normal limits in parenchymal echogenicity. Portal vein is patent on color Doppler imaging with normal direction of blood flow towards the liver.  IMPRESSION: Normal right upper quadrant ultrasound.  Electronically Signed   By: Tiburcio Pea M.D.   On: 05/29/2023 07:34  Lab Results  Component Value Date   WBC 8.5 05/29/2023   HGB 12.8 05/29/2023   HCT 39.5 05/29/2023   MCV 81.1 05/29/2023  PLT 357 05/29/2023   Last metabolic panel Lab Results  Component Value Date   GLUCOSE 108 (H) 05/29/2023   NA 130 (L) 05/29/2023   K 4.3 05/29/2023   CL 91 (L) 05/29/2023   CO2 26 05/29/2023   BUN 18 05/29/2023   CREATININE 0.93 05/29/2023   GFRNONAA >60 05/29/2023   CALCIUM 8.9 05/29/2023   PROT 8.0 05/29/2023   ALBUMIN 3.3 (L) 05/29/2023   BILITOT 1.4 (H) 05/29/2023   ALKPHOS 420 (H) 05/29/2023   AST 131 (H) 05/29/2023   ALT 142 (H) 05/29/2023   ANIONGAP 13 05/29/2023    Assessment and Plan: * Intractable nausea and vomiting Recurrent nausea and vomiting over several days Noted transaminitis with AST and ALT in the 150s, ALP in the 400 with T. bili around 2 CT abdomen pelvis as well as right upper quadrant ultrasound negative for any acute biliary issues IV Protonix for gastritis coverage Will  also reach out to gastroenterology for further recommendations given transaminitis with elevated alk phos and mildly elevated T. bili Follow  Hypertension BP stable Titrate home regimen  Transaminitis AST 130s to 150s ALT 140s to 150s Alk phos in the 400s T bilii 2.2-->1.4  CT abdomen pelvis as well as right upper quadrant ultrasound grossly stable. Case discussed with Dr. Tobi Bastos as a curbside Will check GGT to correlate Formal GI consult for MRCP if GGT elevated Follow  Hyperlipemia Continue statin   History of left PCA stroke Recently on course of DAPT Now on baby aspirin Continue      Advance Care Planning:   Code Status: Full Code   Consults: Curbsided Dr. Tobi Bastos w/ GI. Formal consult if GGT elevated for likely MRCP   Family Communication: Niece at the bedside   Severity of Illness: The appropriate patient status for this patient is INPATIENT. Inpatient status is judged to be reasonable and necessary in order to provide the required intensity of service to ensure the patient's safety. The patient's presenting symptoms, physical exam findings, and initial radiographic and laboratory data in the context of their chronic comorbidities is felt to place them at high risk for further clinical deterioration. Furthermore, it is not anticipated that the patient will be medically stable for discharge from the hospital within 2 midnights of admission.   * I certify that at the point of admission it is my clinical judgment that the patient will require inpatient hospital care spanning beyond 2 midnights from the point of admission due to high intensity of service, high risk for further deterioration and high frequency of surveillance required.*  Author: Floydene Flock, MD 05/29/2023 3:01 PM  For on call review [email or web address].

## 2023-05-29 NOTE — ED Provider Notes (Signed)
[facility] Provider Note    Event Date/Time   First MD Initiated Contact with Patient 05/29/23 0518     (approximate)   History   Emesis   HPI Briana Wright is a 67 y.o. female with recent history of CVA who presents for evaluation worsening nausea and vomiting.  The symptoms have been going on for at least several days.  [facility] was [facility] where she he has been since her discharge from the hospital a couple of weeks ago post CVA.  The patient reports that she has not had any abdominal pain, fever, or diarrhea, and her last bowel movement was normal at about 2 days ago.  However she said that she is always sick to her stomach and has vomited multiple times.  To her knowledge she has never had an abdominal surgery although her medical history indicates she has had a hysterectomy.     Physical Exam   Triage Vital Signs: ED Triage Vitals  Enc Vitals Group     BP 05/29/23 0518 (!) 151/93     Pulse Rate 05/29/23 0521 66     Resp 05/29/23 0518 18     Temp 05/29/23 0524 98.1 F (36.7 C)     Temp Source 05/29/23 0524 Oral     SpO2 05/29/23 0518 97 %     Weight 05/29/23 0523 67.1 kg (148 lb)     Height 05/29/23 0523 1.6 m (5\' 3" )     Head Circumference --      Peak Flow --      Pain Score 05/29/23 0523 0     Pain Loc --      Pain Edu? --      Excl. in GC? --     Most recent vital signs: Vitals:   05/29/23 0530 05/29/23 0600  BP: (!) 130/93 124/85  Pulse: 60 64  Resp: 18 18  Temp:    SpO2: 97% 92%    General: Awake, appears ill but nontoxic. CV:  Good peripheral perfusion.  Regular rate and rhythm. Resp:  Normal effort. Speaking easily and comfortably, no accessory muscle usage nor intercostal retractions.   Abd:  No distention.  Tenderness to palpation of the right upper quadrant with some guarding.  Mild epigastric tenderness.  No lower abdominal tenderness. Other:  No focal neurological deficits appreciated.   ED Results /  Procedures / Treatments   Labs (all labs ordered are listed, but only abnormal results are displayed) Labs Reviewed  COMPREHENSIVE METABOLIC PANEL - Abnormal; Notable for the following components:      Result Value   Sodium 130 (*)    Chloride 91 (*)    Glucose, Bld 108 (*)    Albumin 3.3 (*)    AST 157 (*)    ALT 157 (*)    Alkaline Phosphatase 447 (*)    Total Bilirubin 2.2 (*)    All other components within normal limits  LIPASE, BLOOD  CBC  HEPATITIS PANEL, ACUTE     RADIOLOGY Right upper quadrant ultrasound pending at the time of transfer of care to Dr. Lenard Lance.   PROCEDURES:  Critical Care performed: No  Procedures    IMPRESSION / MDM / ASSESSMENT AND PLAN / ED COURSE  I reviewed the triage vital signs and the nursing notes.  Differential diagnosis includes, but is not limited to, biliary colic or other gallbladder disease including possible choledocholithiasis and cholecystitis, SBO/ileus, viral illness, electrolyte or metabolic abnormality, neoplasm.  Patient's presentation is most consistent with acute presentation with potential threat to life or bodily function.  Labs/studies ordered: Right upper quadrant ultrasound, CMP, CBC, lipase, acute hepatitis panel  Interventions/Medications given:  Medications  sodium chloride 0.9 % bolus 500 mL (500 mLs Intravenous New Bag/Given 05/29/23 0619)  ondansetron (ZOFRAN) injection 4 mg (4 mg Intravenous Given 05/29/23 0619)    (Note:  hospital course my include additional interventions and/or labs/studies not listed above.)  Patient's labs are notable for elevated LFTs including T. bili of 2.2.  There are no recent hepatic function panels against which to compare but it would be consistent with gallbladder disease, particularly given the tenderness to palpation of the right upper quadrant.  I ordered an acute hepatitis panel to investigate the possibility of additional reasons for the  elevated LFTs, anticipate the patient will likely need to be admitted.  Right upper quadrant ultrasound is pending.  I ordered 500 mL of normal saline IV bolus given the patient's mild hyponatremia and probable volume depletion in the setting of persistent nausea and/or vomiting.  I anticipate that if the right upper quadrant is inconclusive, the patient will likely benefit from a CT of the abdomen and pelvis.  The patient is on the cardiac monitor to evaluate for evidence of arrhythmia and/or significant heart rate changes.  Transferring ED care to Dr. Lenard Lance at 7 AM to follow-up on the ultrasound results, reassess the patient, and determine additional imaging that may be needed.      FINAL CLINICAL IMPRESSION(S) / ED DIAGNOSES   Final diagnoses:  Nausea and vomiting, unspecified vomiting type  Elevated LFTs  Hyperbilirubinemia     Rx / DC Orders   ED Discharge Orders     None        Note:  This document was prepared using Dragon voice recognition software and may include unintentional dictation errors.   Loleta Rose, MD 05/29/23 [phone]

## 2023-05-29 NOTE — ED Notes (Signed)
Pt stating she needs to spit, then states she feels like she is going to throw up. Pt then began dry heaving. Dr. Lenard Lance made aware

## 2023-05-30 ENCOUNTER — Observation Stay: Payer: Medicare Other

## 2023-05-30 ENCOUNTER — Inpatient Hospital Stay: Payer: Medicare Other

## 2023-05-30 DIAGNOSIS — E871 Hypo-osmolality and hyponatremia: Secondary | ICD-10-CM | POA: Diagnosis present

## 2023-05-30 DIAGNOSIS — H53461 Homonymous bilateral field defects, right side: Secondary | ICD-10-CM | POA: Diagnosis present

## 2023-05-30 DIAGNOSIS — F17200 Nicotine dependence, unspecified, uncomplicated: Secondary | ICD-10-CM | POA: Diagnosis present

## 2023-05-30 DIAGNOSIS — G9341 Metabolic encephalopathy: Secondary | ICD-10-CM | POA: Diagnosis not present

## 2023-05-30 DIAGNOSIS — K2970 Gastritis, unspecified, without bleeding: Secondary | ICD-10-CM | POA: Diagnosis present

## 2023-05-30 DIAGNOSIS — E785 Hyperlipidemia, unspecified: Secondary | ICD-10-CM | POA: Diagnosis present

## 2023-05-30 DIAGNOSIS — E876 Hypokalemia: Secondary | ICD-10-CM | POA: Diagnosis present

## 2023-05-30 DIAGNOSIS — I69320 Aphasia following cerebral infarction: Secondary | ICD-10-CM | POA: Diagnosis not present

## 2023-05-30 DIAGNOSIS — R112 Nausea with vomiting, unspecified: Secondary | ICD-10-CM | POA: Diagnosis present

## 2023-05-30 DIAGNOSIS — I69398 Other sequelae of cerebral infarction: Secondary | ICD-10-CM | POA: Diagnosis not present

## 2023-05-30 DIAGNOSIS — Z90710 Acquired absence of both cervix and uterus: Secondary | ICD-10-CM | POA: Diagnosis not present

## 2023-05-30 DIAGNOSIS — Z79899 Other long term (current) drug therapy: Secondary | ICD-10-CM | POA: Diagnosis not present

## 2023-05-30 DIAGNOSIS — R001 Bradycardia, unspecified: Secondary | ICD-10-CM | POA: Diagnosis not present

## 2023-05-30 DIAGNOSIS — R7989 Other specified abnormal findings of blood chemistry: Secondary | ICD-10-CM

## 2023-05-30 DIAGNOSIS — R17 Unspecified jaundice: Secondary | ICD-10-CM | POA: Diagnosis present

## 2023-05-30 DIAGNOSIS — Z7982 Long term (current) use of aspirin: Secondary | ICD-10-CM | POA: Diagnosis not present

## 2023-05-30 DIAGNOSIS — I10 Essential (primary) hypertension: Secondary | ICD-10-CM | POA: Diagnosis present

## 2023-05-30 DIAGNOSIS — I7122 Aneurysm of the aortic arch, without rupture: Secondary | ICD-10-CM | POA: Diagnosis present

## 2023-05-30 LAB — HEPATITIS A ANTIBODY, TOTAL: hep A Total Ab: NONREACTIVE

## 2023-05-30 LAB — PROTIME-INR
INR: 1.2 (ref 0.8–1.2)
Prothrombin Time: 15.7 seconds — ABNORMAL HIGH (ref 11.4–15.2)

## 2023-05-30 LAB — COMPREHENSIVE METABOLIC PANEL
ALT: 119 U/L — ABNORMAL HIGH (ref 0–44)
AST: 103 U/L — ABNORMAL HIGH (ref 15–41)
Albumin: 3 g/dL — ABNORMAL LOW (ref 3.5–5.0)
Alkaline Phosphatase: 363 U/L — ABNORMAL HIGH (ref 38–126)
Anion gap: 14 (ref 5–15)
BUN: 14 mg/dL (ref 8–23)
CO2: 25 mmol/L (ref 22–32)
Calcium: 8.8 mg/dL — ABNORMAL LOW (ref 8.9–10.3)
Chloride: 98 mmol/L (ref 98–111)
Creatinine, Ser: 0.76 mg/dL (ref 0.44–1.00)
GFR, Estimated: >60 mL/min (ref >60)
Glucose, Bld: 93 mg/dL (ref 70–99)
Potassium: 3.6 mmol/L (ref 3.5–5.1)
Sodium: 136 mmol/L (ref 135–145)
Total Bilirubin: 1.2 mg/dL (ref 0.3–1.2)
Total Protein: 7.2 g/dL (ref 6.5–8.1)

## 2023-05-30 LAB — HEPATITIS B CORE ANTIBODY, TOTAL: Hep B Core Total Ab: NONREACTIVE

## 2023-05-30 LAB — CBC
HCT: 35.3 % — ABNORMAL LOW (ref 36.0–46.0)
Hemoglobin: 11.4 g/dL — ABNORMAL LOW (ref 12.0–15.0)
MCH: 26.2 pg (ref 26.0–34.0)
MCHC: 32.3 g/dL (ref 30.0–36.0)
MCV: 81.1 fL (ref 80.0–100.0)
Platelets: 318 10*3/uL (ref 150–400)
RBC: 4.35 MIL/uL (ref 3.87–5.11)
RDW: 15.4 % (ref 11.5–15.5)
WBC: 7.6 10*3/uL (ref 4.0–10.5)
nRBC: 0 % (ref 0.0–0.2)

## 2023-05-30 LAB — ACETAMINOPHEN LEVEL: Acetaminophen (Tylenol), Serum: <10 ug/mL — ABNORMAL LOW (ref 10–30)

## 2023-05-30 LAB — GLUCOSE, CAPILLARY: Glucose-Capillary: 78 mg/dL (ref 70–99)

## 2023-05-30 MED ORDER — SODIUM CHLORIDE 0.9 % IV SOLN: INTRAVENOUS | Status: DC

## 2023-05-30 MED ORDER — ENSURE ENLIVE PO LIQD
237.0000 mL | Freq: Two times a day (BID) | ORAL | Status: DC
  Administered 2023-05-31 – 2023-06-01 (×2): 237 mL via ORAL

## 2023-05-30 MED ORDER — CARVEDILOL 6.25 MG PO TABS
6.2500 mg | ORAL_TABLET | Freq: Two times a day (BID) | ORAL | Status: DC
  Administered 2023-05-30 – 2023-06-01 (×4): 6.25 mg via ORAL
  Filled 2023-05-30 (×4): qty 1

## 2023-05-30 MED ORDER — METOCLOPRAMIDE HCL 5 MG/ML IJ SOLN
10.0000 mg | Freq: Once | INTRAMUSCULAR | Status: AC
  Administered 2023-05-30: 10 mg via INTRAVENOUS
  Filled 2023-05-30: qty 2

## 2023-05-30 MED ORDER — SENNOSIDES-DOCUSATE SODIUM 8.6-50 MG PO TABS: 1.0000 | ORAL_TABLET | Freq: Every evening | ORAL | Status: DC | PRN

## 2023-05-30 MED ORDER — ADULT MULTIVITAMIN W/MINERALS CH
1.0000 | ORAL_TABLET | Freq: Every day | ORAL | Status: DC
  Administered 2023-05-31 – 2023-06-01 (×2): 1 via ORAL
  Filled 2023-05-30 (×2): qty 1

## 2023-05-30 MED ORDER — ASPIRIN 81 MG PO CHEW
81.0000 mg | CHEWABLE_TABLET | Freq: Every day | ORAL | Status: DC
  Administered 2023-05-30: 81 mg via ORAL
  Filled 2023-05-30: qty 1

## 2023-05-30 MED ORDER — CLONIDINE HCL 0.1 MG PO TABS
0.1000 mg | ORAL_TABLET | Freq: Two times a day (BID) | ORAL | Status: DC
  Administered 2023-05-30 – 2023-06-01 (×4): 0.1 mg via ORAL
  Filled 2023-05-30 (×4): qty 1

## 2023-05-30 NOTE — Significant Event (Signed)
Responded to Code Stroke page. Pt RN in room, Unit CN, RR ICU RN arrived. Pt awake alert, able to answer questions. Blood sugar had been checked, vital signs checked. Pt able to answer.

## 2023-05-30 NOTE — Progress Notes (Signed)
   05/30/23 2100  Spiritual Encounters  Type of Visit Attempt (pt unavailable)  OnCall Visit Yes  Spiritual Care Plan  Spiritual Care Issues Still Outstanding Chaplain will continue to follow   Patient was being seen by medical staff there was no family. Will we visit patient later or referral to on coming Chaplain.

## 2023-05-30 NOTE — Progress Notes (Incomplete)
  Progress Note   [name] ZOX:[id number] DOB: 1956-08-11 DOA: 05/29/2023     0 DOS: the patient was seen and examined on 05/30/2023   Brief hospital course: No notes on file  Assessment and Plan: * Intractable nausea and vomiting Recurrent nausea and vomiting over several days Noted transaminitis with AST and ALT in the 150s, ALP in the 400 with T. bili around 2 CT abdomen pelvis as well as right upper quadrant ultrasound negative for any acute biliary issues IV Protonix for gastritis coverage Will also reach out to gastroenterology for further recommendations given transaminitis with elevated alk phos and mildly elevated T. bili Follow  Hypertension BP stable Titrate home regimen  Transaminitis AST 130s to 150s ALT 140s to 150s Alk phos in the 400s T bilii 2.2-->1.4  CT abdomen pelvis as well as right upper quadrant ultrasound grossly stable. Case discussed with Dr. Tobi Bastos as a curbside Will check GGT to correlate Formal GI consult for MRCP if GGT elevated Follow  Hyperlipemia Continue statin   History of left PCA stroke Recently on course of DAPT Now on baby aspirin Continue      {Tip this will not be part of the note when signed Body mass index is 26.22 kg/m. , ,  (Optional):26781}  Subjective: ***  Physical Exam: Vitals:   05/29/23 2042 05/30/23 0531 05/30/23 0647 05/30/23 0829  BP: 129/81 (!) 176/88 (!) 158/91 (!) 156/78  Pulse: 65 66  (!) 59  Resp: 12 18  17   Temp: 98.1 F (36.7 C) 97.8 F (36.6 C)  97.9 F (36.6 C)  TempSrc:  Oral    SpO2: 98% 100%  96%  Weight:      Height:       *** Data Reviewed: {Tip this will not be part of the note when signed- Document your independent interpretation of telemetry tracing, EKG, lab, Radiology test or any other diagnostic tests. Add any new diagnostic test ordered today. (Optional):26781} {Results:26384}  Family Communication: ***  Disposition: Status is: Observation {Observation:23811}   Planned Discharge Destination: {DISCHARGE DESTINATION_TRH:27031} {Tip this will not be part of the note when signed  DVT Prophylaxis  ., Scds  Enoxaparin (lovenox) injection 40 mg  (Optional):26781}   Time spent: *** minutes  Author: Loyce Dys, MD 05/30/2023 2:30 PM  For on call review [email or web address].

## 2023-05-30 NOTE — Consult Note (Signed)
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 She was noted to have some word-finding difficulty in addition to her right homonymous hemianopia. CT Head and it showed the evolving left PCA stroke. There was some superimposed serpiginous hyperdensities that could represent laminar necrosis vs petechial hemorrhage. Due to her possible aphasia, a CTA head and neck and CTP were ordered, but pt refused to have the IV placed for the CT even with her niece trying to convince her by phone. Pt's symptoms might be due to some superimposed delirium in addition to her deficits from her stroke although a new ischemic event cannot be excluded. Since there was also some possible petechial hemorrhage seen on her CT, recommend holding ASA for now and obtaining an MRI brain without contrast to rule out stroke or hemorrhagic conversion of her PCA CVA.  Our recommendations are outlined below.  Recommendations:        Stroke/Telemetry Floor       Neuro Checks       Bedside Swallow Eval       DVT Prophylaxis       IV Fluids, Normal Saline       Head of Bed 30 Degrees       Euglycemia and Avoid Hyperthermia (PRN Acetaminophen)       Hold ASA for now       MRI brain without contrast  Sign Out:       Discussed with Primary Attending    ------------------------------------------------------------------------------  Advanced Imaging: Advanced Imaging Deferred because:  Pt refused the IV for the CTA/P   Metrics: Last Known Well: 05/30/2023 16:35:00 [facility]  Notification Time: 05/30/2023 21:40:58 Stamp Time: 05/30/2023 21:40:58 Initial Response Time: 05/30/2023 21:43:47 Symptoms: AMS. Initial patient interaction: 05/30/2023 21:45:21 NIHSS Assessment Completed: 05/30/2023 22:01:31 Patient is not a candidate for Thrombolytic. Thrombolytic Medical Decision: 05/30/2023 21:49:23 Patient was not deemed candidate for Thrombolytic because of following reasons: Stroke in last 3 months.  I personally Reviewed the CT Head and it showed the evolving left PCA stroke. There was some superimposed serpiginous hyperdensities that could represent laminar necrosis vs petechial hemorrhage.  Primary Provider Notified of Diagnostic Impression and Management Plan on: 05/30/2023 22:07:51 Spoke With: Jannette Fogo, NP Able to Reach 05/30/2023 22:07:51    ------------------------------------------------------------------------------  History of Present Illness: Patient is a 67 year old Female.  Inpatient stroke alert was called for symptoms of AMS. 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 SHe was noted this evening to have increased confusion, not able to remember her niece's name and also some possible right sided ataxia on FNF that improved. Per PT noted at 16:35, she was oriented x 4 and answering questions appropriately.   Past Medical History:      Hypertension      Hyperlipidemia  Medications:  No Anticoagulant use  Antiplatelet use: Yes ASA 81  mg daily Reviewed EMR for current medications  Allergies:  NKDA  Social History: Smoking: No  Family History:  There is no family history of premature cerebrovascular disease pertinent to this consultation  ROS : 14 Points Review of Systems was performed and was negative except mentioned in HPI.  Past Surgical History: There Is No Surgical History  Contributory To Today's Visit    Examination: BP(151/93), Pulse(67), 1A: Level of Consciousness - Alert; keenly responsive + 0 1B: Ask Month and Age - 1 Question Right + 1 1C: Blink Eyes & Squeeze Hands - Performs Both Tasks + 0 2: Test Horizontal Extraocular Movements - Normal + 0 3: Test Visual Fields - No Visual Loss + 0 4: Test Facial Palsy (Use Grimace if Obtunded) - Normal symmetry + 0 5A: Test Left Arm Motor Drift - No Drift for 10 Seconds + 0 5B: Test Right Arm Motor Drift - No Drift for 10 Seconds + 0 6A: Test Left Leg Motor Drift - No Drift for 5 Seconds + 0 6B: Test Right Leg Motor Drift - No Drift for 5 Seconds + 0 7: Test Limb Ataxia (FNF/Heel-Shin) - No Ataxia + 0 8: Test Sensation - Normal; No sensory loss + 0 9: Test Language/Aphasia - Mild-Moderate Aphasia: Some Obvious Changes, Without Significant Limitation + 1 10: Test Dysarthria - Normal + 0 11: Test Extinction/Inattention - No abnormality + 0  NIHSS Score: 2  NIHSS Free Text : PT had touble thinkng of her nieces namd and naming objects, incorrect age, + right homonymous hemianopia,  Pre-Morbid Modified Rankin Scale: 3 Points = Moderate disability; requiring some help, but able to walk without assistance  Spoke with : Jannette Fogo, NP  This consult was conducted in real time using interactive audio and Immunologist. Patient was informed of the technology being used for this visit and agreed to proceed. Patient located in hospital and provider located at home/office setting.   Patient is being evaluated for possible acute neurologic impairment and high probability of imminent or life-threatening deterioration. I spent total of 35 minutes providing care to this patient, including time for face to face visit via telemedicine, review of medical records, imaging studies and discussion of findings with providers, the patient and/or family.   Dr Jacky Kindle   [facility] For Inpatient follow-up with  [facility] physician please call [facility] [phone]. This is not an outpatient service. Post hospital discharge, please contact hospital directly.  Please do not communicate with [facility] physicians via secure chat. If you have any questions, Please contact [facility]. Please call or reconsult our service if there are any clinical or diagnostic changes.

## 2023-05-30 NOTE — Evaluation (Signed)
Occupational Therapy Evaluation Patient Details Name: Briana Wright MRN: [id number] DOB: August 12, 1956 Today's Date: 05/30/2023   History of Present Illness 67 y.o. female with medical history significant of hypertension, hyperlipidemia, CVA presenting with intractable nausea and vomiting, transaminitis.  Patient noted to have been recently admitted May 16 through May 20 for issues including hypokalemia in setting of decreased p.o. intake, AKI recent CVA status post DAPT treatment.  History primarily from patient's niece at the bedside.  Per report, patient with recurring episodes of nausea and vomiting over multiple days.   Clinical Impression   Patient presenting with decreased Ind in self care,balance, functional mobility/transfers, endurance, and safety awareness. Patient reports she has been at [facility] for rehab since CVA in May 2024. She reports prior to CVA she was Ind in all aspects of care without use of AD and living with husband. Pt states she is not feeling well and continues to be dizzy and nauseated but motivated to return home with family assistance. Pt performed bed mobility with supervision and stands with min A from EOB. Pt taking several steps in room with min guard and then requests to return to bed secondary to feeling nauseated.  Pt endorses husband and children can assist her at home. OT discussed need for family to be with her each time she gets up for safety. Patient will benefit from acute OT to increase overall independence in the areas of ADLs, functional mobility, and safety awareness in order to safely discharge.     Recommendations for follow up therapy are one component of a multi-disciplinary discharge planning process, led by the attending physician.  Recommendations may be updated based on patient status, additional functional criteria and insurance authorization.   Assistance Recommended at Discharge Frequent or constant Supervision/Assistance  Patient can return home  with the following A little help with walking and/or transfers;A little help with bathing/dressing/bathroom;Assistance with cooking/housework;Direct supervision/assist for medications management;Assist for transportation;Help with stairs or ramp for entrance    Functional Status Assessment  Patient has had a recent decline in their functional status and demonstrates the ability to make significant improvements in function in a reasonable and predictable amount of time.  Equipment Recommendations  BSC/3in1;Other (comment) (RW)       Precautions / Restrictions Precautions Precautions: Fall      Mobility Bed Mobility Overal bed mobility: Needs Assistance Bed Mobility: Supine to Sit, Sit to Supine     Supine to sit: Supervision Sit to supine: Supervision        Transfers Overall transfer level: Needs assistance Equipment used: Rolling walker (2 wheels) Transfers: Sit to/from Stand Sit to Stand: Min guard                  Balance Overall balance assessment: Needs assistance   Sitting balance-Leahy Scale: Good     Standing balance support: During functional activity, Bilateral upper extremity supported, Reliant on assistive device for balance Standing balance-Leahy Scale: Fair                             ADL either performed or assessed with clinical judgement   ADL Overall ADL's : Needs assistance/impaired                         Toilet Transfer: Min guard;Minimal assistance;Rolling walker (2 wheels) Toilet Transfer Details (indicate cue type and reason): simulated         Functional mobility during  ADLs: Min guard;Rolling walker (2 wheels)       Vision Patient Visual Report: No change from baseline              Pertinent Vitals/Pain Pain Assessment Pain Assessment: No/denies pain     Hand Dominance Right   Extremity/Trunk Assessment Upper Extremity Assessment Upper Extremity Assessment: RUE deficits/detail RUE Deficits /  Details: strength WFLs with decreased coordination (from CVA)   Lower Extremity Assessment Lower Extremity Assessment: Generalized weakness   Cervical / Trunk Assessment Cervical / Trunk Assessment: Normal   Communication Communication Communication: No difficulties   Cognition Arousal/Alertness: Awake/alert Behavior During Therapy: WFL for tasks assessed/performed Overall Cognitive Status: Within Functional Limits for tasks assessed                                                  Home Living Family/patient expects to be discharged to:: Private residence Living Arrangements: Spouse/significant other Available Help at Discharge: Family;Available PRN/intermittently Type of Home: House Home Access: Stairs to enter [employer] of Steps: 2 steps to front door and then 2 steps inside of home Entrance Stairs-Rails: Left;Right Home Layout: One level     Bathroom Shower/Tub: Tub/shower unit         Home Equipment: None          Prior Functioning/Environment                 ADLs Comments: Pt reports prior to CVA ( 4 weeks ago), pt was Ind with self care and IADLs. She lives with husband (also retired) and she did all cooking and home management tasks.        OT Problem List: Decreased strength;Decreased range of motion;Decreased activity tolerance;Impaired balance (sitting and/or standing);Decreased cognition      OT Treatment/Interventions: Self-care/ADL training;Therapeutic exercise;Patient/family education;Balance training;Energy conservation;Therapeutic activities;DME and/or AE instruction    OT Goals(Current goals can be found in the care plan section) Acute Rehab OT Goals Patient Stated Goal: to go home OT Goal Formulation: With patient Time For Goal Achievement: 06/13/23 Potential to Achieve Goals: Good ADL Goals Pt Will Perform Grooming: with supervision;standing Pt Will Perform Lower Body Dressing: with supervision;sit  to/from stand Pt Will Transfer to Toilet: with supervision;ambulating Pt Will Perform Toileting - Clothing Manipulation and hygiene: with supervision;sit to/from stand  OT Frequency: Min 2X/week       AM-PAC OT "6 Clicks" Daily Activity     Outcome Measure Help from another person eating meals?: A Little Help from another person taking care of personal grooming?: A Little Help from another person toileting, which includes using toliet, bedpan, or urinal?: A Little Help from another person bathing (including washing, rinsing, drying)?: A Little Help from another person to put on and taking off regular upper body clothing?: A Little Help from another person to put on and taking off regular lower body clothing?: A Little 6 Click Score: 18   End of Session Equipment Utilized During Treatment: Rolling walker (2 wheels) Nurse Communication: Mobility status  Activity Tolerance: Patient tolerated treatment well Patient left: with call bell/phone within reach;in bed;with bed alarm set  OT Visit Diagnosis: Unsteadiness on feet (R26.81);Other abnormalities of gait and mobility (R26.89);Muscle weakness (generalized) (M62.81)                Time: 1610-9604 OT Time Calculation (min): 13 min Charges:  OT  General Charges $OT Visit: 1 Visit OT Evaluation $OT Eval Moderate Complexity: 1 Mod OT Treatments $Therapeutic Activity: 8-22 mins  Jackquline Denmark, MS, OTR/L , CBIS ascom [phone]  05/30/23, 11:45 AM

## 2023-05-30 NOTE — Plan of Care (Signed)

## 2023-05-30 NOTE — Evaluation (Signed)
Physical Therapy Evaluation Patient Details Name: Briana Wright MRN: [id number] DOB: [birth year] Today's Date: 05/30/2023  History of Present Illness  67 y.o. female with medical history significant of hypertension, hyperlipidemia, CVA presenting with intractable nausea and vomiting, transaminitis.  Patient noted to have been recently admitted May 16 through May 20 for issues including hypokalemia in setting of decreased p.o. intake, AKI recent CVA status post DAPT treatment.  History primarily from patient's niece at the bedside.  Per report, patient with recurring episodes of nausea and vomiting over multiple days.  Clinical Impression  Patient sidelying in bed upon arrival with niece present in the room. Aox3 to person, place, and date. Patient reported than she wasn't feeling nauseated upon arrival and was willing to get up and walk. UE strength WFL for tasks at hand, diminished R UE coordination. LE strength WFL except for hip flexion demonstrated with a straight leg raise. Independent for bed mobility to move from supine to EOB. At EOB patient because extremely nauseated and dry heaved consistently. Session ended due to patient being unable to continue and nursing was notified of patients request for nausea medication. Transfers and ambulation need to be further tested during next session. Pt will continue to receive skilled PT services while admitted and will defer to TOC/care team for updates regarding disposition planning.      Recommendations for follow up therapy are one component of a multi-disciplinary discharge planning process, led by the attending physician.  Recommendations may be updated based on patient status, additional functional criteria and insurance authorization.  Follow Up Recommendations Can patient physically be transported by private vehicle: Yes     Assistance Recommended at Discharge Intermittent Supervision/Assistance  Patient can return home with the following   Help with stairs or ramp for entrance;Assist for transportation;Assistance with cooking/housework;A little help with bathing/dressing/bathroom;A little help with walking and/or transfers    Equipment Recommendations Rolling walker (2 wheels)  Recommendations for Other Services       Functional Status Assessment Patient has had a recent decline in their functional status and demonstrates the ability to make significant improvements in function in a reasonable and predictable amount of time.     Precautions / Restrictions Precautions Precautions: Fall Restrictions Weight Bearing Restrictions: No      Mobility  Bed Mobility Overal bed mobility: Independent Bed Mobility: Rolling, Supine to Sit Rolling: Independent   Supine to sit: Independent Sit to supine: Independent   General bed mobility comments: Patient able to sit to EOB with no assistance or supervision needed.    Transfers                   General transfer comment: Not assessed due to patient nausea at EOB.    Ambulation/Gait               General Gait Details: unable due to nausea.  Stairs            Wheelchair Mobility    Modified Rankin (Stroke Patients Only)       Balance Overall balance assessment: Independent Sitting-balance support: Bilateral upper extremity supported Sitting balance-Leahy Scale: Good         Standing balance comment: Not tested.                             Pertinent Vitals/Pain Pain Assessment Pain Assessment: No/denies pain    Home Living Family/patient expects to be discharged to:: Private residence Living Arrangements:  Spouse/significant other Available Help at Discharge: Family;Available PRN/intermittently Type of Home: House Home Access: Stairs to enter Entrance Stairs-Rails: None Entrance Stairs-Number of Steps: 2 steps to front door and then 2 steps inside of home   Home Layout: One level Home Equipment: None Additional  Comments: Patient states that her family is able to help her intermittently in the home as needed. Conflicting information on the status of rails for her steps.    Prior Function Prior Level of Function : Independent/Modified Independent             Mobility Comments: Patient previously receving rehab post CVA. Used RW with sessions and requested one for home. ADLs Comments: Pt reports prior to CVA ( 4 weeks ago), pt was Ind with self care and IADLs. She lives with husband (also retired) and she did all cooking and home management tasks.     Hand Dominance   Dominant Hand: Right    Extremity/Trunk Assessment   Upper Extremity Assessment Upper Extremity Assessment: RUE deficits/detail RUE Deficits / Details: strength WFLs with decreased coordination (from CVA) RUE Sensation: WNL RUE Coordination: decreased gross motor    Lower Extremity Assessment Lower Extremity Assessment: Generalized weakness (Overall WFL for tasks.Generalized weakness with hip flexion.)       Communication   Communication: No difficulties  Cognition Arousal/Alertness: Awake/alert Behavior During Therapy: WFL for tasks assessed/performed Overall Cognitive Status: Within Functional Limits for tasks assessed                                 General Comments: AOx4        General Comments      Exercises     Assessment/Plan    PT Assessment Patient needs continued PT services  PT Problem List Decreased activity tolerance;Decreased strength;Decreased range of motion;Decreased coordination       PT Treatment Interventions DME instruction;Gait training;Stair training;Therapeutic activities;Therapeutic exercise;Patient/family education    PT Goals (Current goals can be found in the Care Plan section)  Acute Rehab PT Goals Patient Stated Goal: To go home PT Goal Formulation: With patient/family Time For Goal Achievement: 06/13/23 Potential to Achieve Goals: Good    Frequency Min  3X/week     Co-evaluation               AM-PAC PT "6 Clicks" Mobility  Outcome Measure Help needed turning from your back to your side while in a flat bed without using bedrails?: None Help needed moving from lying on your back to sitting on the side of a flat bed without using bedrails?: None Help needed moving to and from a bed to a chair (including a wheelchair)?: A Little Help needed standing up from a chair using your arms (e.g., wheelchair or bedside chair)?: A Little Help needed to walk in hospital room?: A Little Help needed climbing 3-5 steps with a railing? : A Lot 6 Click Score: 19    End of Session   Activity Tolerance: Treatment limited secondary to medical complications (Comment) (Nausea) Patient left: in bed;with call bell/phone within reach;with bed alarm set Nurse Communication: Mobility status PT Visit Diagnosis: Unsteadiness on feet (R26.81);Other abnormalities of gait and mobility (R26.89);History of falling (Z91.81)    Time: 0981-1914 PT Time Calculation (min) (ACUTE ONLY): 22 min   Charges:             Malachi Carl, SPT   Malachi Carl 05/30/2023, 4:35 PM

## 2023-05-30 NOTE — Significant Event (Signed)
Rapid Response Event Note   Reason for Call :  Code stroke  Initial Focused Assessment:  Rapid response RN arrived in patient's room with patient surrounded by 1C charge nurse. Per patient's nurse, patient had history of stroke but nurse had her last night and last night she was alert and oriented and able to answer questions. Tonight patient was unable to name family members, unable to state date, unable to state why she was in the hospital, and unable to state her age or date of birth. BP 166/87 MAP 109, HR 64, oxygen saturations 100 % on room air, respirations unlabored. CBG 78  NIH performed and was a 4, see vital sign flowsheets (1 point in 1b questions, 2 for limb ataxia for right leg and arm, and 1 for best language). Last known well at this time believed to be 08:00 for dayshift assessment.  Interventions:  Patient transported to CT to obtain CT head without contrast per code stroke order set. Arrived in CT at 21:14, where Manuela Schwartz NP met with rapid response RN and patient's nurse and . After patient on CT table, activated tele neuro cart to connect with tele neuro team. LKW revised to 16:35 based on PT note. Repeated NIH in CT showed no ataxia, but difficulties with memory and language still for an NIH of 2. While in CT, tele neurologist wanted to obtain CT angiogram and CT perfusion. This RN unable to obtain IV in right [facility] and when CT staff just began to look at patient's left arm, patient began crying and refusing any further attempts, including for staff to contact IV team to try. NP and Neurologist attempted to talk to patient about why neurologist wanted to obtain scans but patient kept repeating that she did not want the IVs required to get the scans and that she wanted her niece Britta Mccreedy. Patient's nurse called niece Britta Mccreedy per patient request. Patient, patient's nurse, and Manuela Schwartz NP, spoke to [place] to give updates on situation. As patient still refused scans, did not  attempt any further IV access.  Helped transported patient back to patient's room. Vital signs just before rapid response left were HR 71, BP 160/89 MAP 11, oxygen saturations 99% on room air, RR 22 unlabored.   Plan of Care:  Patient to remain on 1C for now. Manuela Schwartz NP making adjustments to patient's orders as needed after discussion with tele neurologist. 1C to reach out to rapid response with any further needs.  Event Summary:   MD Notified: Manuela Schwartz NP, Dr. Manson Passey with tele neurology Call Time: 21:00 Arrival Time: 21:02 End Time: 22:24  Bennie Dallas, RN

## 2023-05-30 NOTE — Consult Note (Signed)
Wyline Mood , MD [address], [place], [place], [address] [address], [address], [place], [place], [address] Phone: [phone]  Fax: [phone]  Consultation  Referring Provider:    Dr Alvester Morin Primary Care Physician:  Corky Downs, MD Primary Gastroenterologist:  None          Reason for Consultation:   Abnormal LFTs  Date of Admission:  05/29/2023 Date of Consultation:  05/30/2023         HPI:   Briana Wright is a 67 y.o. female presented to the hospital with meeting.  History of hypertension hyperlipidemia CVA admitted in May for hypokalemia due to decreased p.o. intake.  The nausea and vomiting apparently had been going on for multiple days the patient was not able to give any history.  In the emergency room noted to have normal LFTs with alkaline phosphatase of 447 ALT 157 AST 157 total bilirubin 2.2 creatinine of 0.93.,  GGT elevated at 153 HIV negative CK elevated at 281 hemoglobin 11.4 g MCV 81.1 acute hepatitis panel negative.  Lipase not elevated.  Right upper quadrant ultrasound no abnormalities seen CT scan of the abdomen pelvis with contrast showed no acute findings.   She has been on 1300 mg of Tylenol up to 4 times a day for pain or headache   She denies any over-the-counter medications or herbal supplements excess alcohol.  No family history of liver disease.  Although she has been on a high dose of as needed Tylenol she does not recollect taking more than 1 tablet every few days.  She states all of this began a few days back when she had some nausea vomiting and abdominal pain since then the nausea vomiting has resolved.  Denies any diarrhea.  Feels better after coming to the hospital.  Past Medical History:  Diagnosis Date   Hypertension    Stroke [facility])     Past Surgical History:  Procedure Laterality Date   ABDOMINAL HYSTERECTOMY      Prior to Admission medications   Medication Sig Start Date End Date Taking? Authorizing Provider   pantoprazole (PROTONIX) 40 MG tablet Take 1 tablet (40 mg total) by mouth daily. 05/29/23 05/28/24 Yes Minna Antis, MD  sucralfate (CARAFATE) 1 g tablet Take 1 tablet (1 g total) by mouth 4 (four) times daily for 15 days. 05/29/23 06/13/23 Yes Minna Antis, MD  acetaminophen (TYLENOL) 325 MG tablet Take 2 tablets (650 mg total) by mouth every 6 (six) hours as needed for mild pain, fever or headache (or Fever >/= 101). 05/13/23   Sunnie Nielsen, DO  aspirin 81 MG chewable tablet Chew 81 mg by mouth daily. 11/02/15   [provider]  atorvastatin (LIPITOR) 10 MG tablet Take 8 tablets (80 mg total) by mouth at bedtime. 05/13/23   Sunnie Nielsen, DO  carvedilol (COREG) 6.25 MG tablet Take 6.25 mg by mouth 2 (two) times daily with a meal. 06/19/21   [provider]  cloNIDine (CATAPRES) 0.1 MG tablet Take 0.1 mg by mouth every 12 (twelve) hours. 04/30/23 04/29/24  [provider]  nicotine (NICODERM CQ) 7 mg/24hr patch Place 1 patch (7 mg total) onto the skin daily. Patient not taking: Reported on 05/09/2023 10/08/15   Adrian Saran, MD  senna-docusate (SENOKOT-S) 8.6-50 MG tablet Take 1 tablet by mouth at bedtime as needed for mild constipation. 05/13/23   Sunnie Nielsen, DO    Family History  Family history unknown: Yes     Social History  Tobacco Use   Smoking status: Every Day  Substance Use Topics   Alcohol use: No   Drug use: Never    Allergies as of 05/29/2023   (No Known Allergies)    Review of Systems:    All systems reviewed and negative except where noted in HPI.   Physical Exam:  Vital signs in last 24 hours: Temp:  [97.8 F (36.6 C)-98.3 F (36.8 C)] 97.9 F (36.6 C) (06/06 0829) Pulse Rate:  [59-74] 59 (06/06 0829) Resp:  [12-18] 17 (06/06 0829) BP: (125-176)/(78-94) 156/78 (06/06 0829) SpO2:  [94 %-100 %] 96 % (06/06 0829) Last BM Date : 05/29/23 General:   Pleasant, cooperative in NAD Head:  Normocephalic and  atraumatic. Eyes:   No icterus.   Conjunctiva pink. PERRLA. Ears:  Normal auditory acuity. Neck:  Supple; no masses or thyroidomegaly Lungs: Respirations even and unlabored. Lungs clear to auscultation bilaterally.   No wheezes, crackles, or rhonchi.  Heart:  Regular rate and rhythm;  Without murmur, clicks, rubs or gallops Abdomen:  Soft, nondistended, nontender. Normal bowel sounds. No appreciable masses or hepatomegaly.  No rebound or guarding.  Neurologic:  Alert and oriented x3;  grossly normal neurologically. Skin:  Intact without significant lesions or rashes. Cervical Nodes:  No significant cervical adenopathy. Psych:  Alert and cooperative. Normal affect.  LAB RESULTS: Recent Labs    05/29/23 0528 05/30/23 0527  WBC 8.5 7.6  HGB 12.8 11.4*  HCT 39.5 35.3*  PLT 357 318   BMET Recent Labs    05/29/23 0528 05/30/23 0527  NA 130* 136  K 4.3 3.6  CL 91* 98  CO2 26 25  GLUCOSE 108* 93  BUN 18 14  CREATININE 0.93 0.76  CALCIUM 8.9 8.8*   LFT Recent Labs    05/29/23 1315 05/30/23 0527  PROT 8.0 7.2  ALBUMIN 3.3* 3.0*  AST 131* 103*  ALT 142* 119*  ALKPHOS 420* 363*  BILITOT 1.4* 1.2  BILIDIR 0.3*  --   IBILI 1.1*  --    PT/INR No results for input(s): "LABPROT", "INR" in the last 72 hours.  STUDIES: CT ABDOMEN PELVIS W CONTRAST  Result Date: 05/29/2023 CLINICAL DATA:  Abdominal pain EXAM: CT ABDOMEN AND PELVIS WITH CONTRAST TECHNIQUE: Multidetector CT imaging of the abdomen and pelvis was performed using the standard protocol following bolus administration of intravenous contrast. RADIATION DOSE REDUCTION: This exam was performed according to the departmental dose-optimization program which includes automated exposure control, adjustment of the mA and/or kV according to patient size and/or use of iterative reconstruction technique. CONTRAST:  OMNIPAQUE IOHEXOL 300 MG/ML  SOLN COMPARISON:  None Available. FINDINGS: Lower chest: No acute abnormality.  Hepatobiliary: No focal liver abnormality is seen. No gallstones, gallbladder wall thickening, or biliary dilatation. Pancreas: Unremarkable. No pancreatic ductal dilatation or surrounding inflammatory changes. Spleen: Normal in size without focal abnormality. Adrenals/Urinary Tract: Bilateral adrenal glands are unremarkable. No hydronephrosis or nephrolithiasis. Small bilateral low-attenuation renal lesions which are too small to accurately characterize but likely simple cysts, no specific follow-up imaging is recommended. Bladder is unremarkable. Stomach/Bowel: Stomach is within normal limits. No evidence of bowel wall thickening, distention, or inflammatory changes. Vascular/Lymphatic: Aortic atherosclerosis. No enlarged abdominal or pelvic lymph nodes. Reproductive: No adnexal masses. Other: No abdominal wall hernia or abnormality. No abdominopelvic ascites. Musculoskeletal: No acute or significant osseous findings. IMPRESSION: 1. No acute findings in the abdomen or pelvis. 2.  Aortic Atherosclerosis (ICD10-I70.0). Electronically Signed   By: Aliene Altes.D.  On: 05/29/2023 10:15   US ABDOMEN LIMITED RUQ (LIVER/GB)  Result Date: 05/29/2023 CLINICAL DATA:  Right upper quadrant tenderness with nausea EXAM: ULTRASOUND ABDOMEN LIMITED RIGHT UPPER QUADRANT COMPARISON:  None Available. FINDINGS: Gallbladder: No gallstones or wall thickening visualized. No sonographic Murphy sign noted by sonographer. Common bile duct: Diameter: 5 mm Liver: No focal lesion identified. Within normal limits in parenchymal echogenicity. Portal vein is patent on color Doppler imaging with normal direction of blood flow towards the liver. IMPRESSION: Normal right upper quadrant ultrasound. Electronically Signed   By: Tiburcio Pea M.D.   On: 05/29/2023 07:34      Impression / Plan:   Briana Wright is a 67 y.o. y/o female presents with nausea, vomiting and abnormal LFT's . H/o CVA, differentials are infectious hepatitis vs  ischemic (less likely as usually transaminases are much higher) or infection or medication related. Ambulatory orders for Tylenol were at a very high dose over 4 g a day unsure how much she has taken.  Plan 1.  Full hepatitis panel for EBV HSV CMV VZV 2.  Check Tylenol levels 3.  Doppler ultrasound of the liver 4. Avoid hepato toxic meds.  5. No INR checked will order  Thank you for involving me in the care of this patient.      LOS: 0 days   Wyline Mood, MD  05/30/2023, 8:40 AM

## 2023-05-30 NOTE — Progress Notes (Signed)
Code stroke timeline  LKW 1635, ROS 2034 2121 Code stroke cart activation, pt in CT at this time 2125 MD at bedside 2140 Neuro T[facility] paged 2144 Neuro [facility] on camera, Dr. Manson Passey  No CTA/P at this time d/t pt refusal per Neuro TSM[facility].   -Lowanda Foster Telestroke RN

## 2023-05-30 NOTE — Progress Notes (Signed)
Progress Note   [name] ZOX:[id number] DOB: Dec 19, 1956 DOA: 05/29/2023     0 DOS: the patient was seen and examined on 05/30/2023    Subjective:  Patient seen and examined at bedside this morning She did complain of nausea but no vomiting According to her abdominal pain is improving Denies diarrhea chest pain or cough   Brief hospital course: From Dr. Alvester Morin HPI "Briana Wright is a 67 y.o. female with medical history significant of hypertension, hyperlipidemia, CVA presenting with intractable nausea and vomiting, transaminitis.  Patient noted to have been recently admitted May 16 through May 20 for issues including hypokalemia in setting of decreased p.o. intake, AKI recent CVA status post DAPT treatment.  History primarily from patient's niece at the bedside.  Per report, patient with recurring episodes of nausea and vomiting over multiple days.  Has been unable to tolerate any solid or liquid intake.  Emesis nonbloody nonbilious.  Mild generalized abdominal pain.  No fevers or chills.  No nausea or vomiting.  No chest pain or shortness of breath.  Has been somewhat able to tolerate baby aspirin as well as oral medication regimen.  No recent antibiotic use.  No reported heavy NSAID use.  No reported diarrhea. Presented to the ER afebrile, hemodynamically stable.  White count 8.5, hemoglobin 12.8, platelets 357, creatinine 0.93, AST 157, ALT 157, alk phos 447, T. bili 2.2.  CT of the abdomen pelvis as well as right upper quadrant ultrasound grossly stable."  Assessment and Plan:  Intractable nausea and vomiting-improving Continue IV Protonix for gastritis coverage In the setting of transaminitis GI on board and case discussed Continue Zofran as needed for nausea and vomiting   Hypertension BP stable Continue current home medications   Transaminitis We will continue to monitor liver function test closely I have personally reviewed patient's CT of the abdomen and pelvic  imaging that did not show any biliary obstruction Gastroenterologist Dr. Tobi Bastos on board and case discussed Follow-up on liver ultrasound  GGT slightly elevated in the setting of elevated ALP    History of left PCA stroke Recently on course of DAPT Now on baby aspirin Continue aspirin for now therapy    Advance Care Planning:   Code Status: Full Code    Consults: Dr. Tobi Bastos w/ GI.    Family Communication: Niece at the bedside      Physical Exam Constitutional:      Appearance: She is normal weight.  HENT: Atraumatic normocephalic Eyes:     Pupils: Pupils are equal, round, and reactive to light.  Cardiovascular:     Rate and Rhythm: Normal rate and regular rhythm.  Pulmonary:     Effort: Pulmonary effort is normal.  Abdominal:     General: Abdomen is flat.  Mid abdominal tenderness Musculoskeletal:        General: Normal range of motion.  Skin:    General: Skin is dry.  Neurological:     General: No focal deficit present.  Alert and oriented x 3   Psychiatric:        Mood and Affect: Mood normal.   Vitals:   05/29/23 2042 05/30/23 0531 05/30/23 0647 05/30/23 0829  BP: 129/81 (!) 176/88 (!) 158/91 (!) 156/78  Pulse: 65 66  (!) 59  Resp: 12 18  17   Temp: 98.1 F (36.7 C) 97.8 F (36.6 C)  97.9 F (36.6 C)  TempSrc:  Oral    SpO2: 98% 100%  96%  Weight:  Height:        Data Reviewed: Have personally reviewed patient's laboratory results showing sodium 136 potassium 3.6 AST 103 ALT 119 ALP 363 gamma GT 153  Family Communication: Discussed with patient's family present at bedside Author: Loyce Dys, MD 05/30/2023 2:52 PM  For on call review [email or web address].

## 2023-05-31 ENCOUNTER — Encounter: Payer: Self-pay | Admitting: Family Medicine

## 2023-05-31 ENCOUNTER — Inpatient Hospital Stay: Payer: Medicare Other

## 2023-05-31 DIAGNOSIS — R7989 Other specified abnormal findings of blood chemistry: Secondary | ICD-10-CM | POA: Diagnosis not present

## 2023-05-31 DIAGNOSIS — R112 Nausea with vomiting, unspecified: Secondary | ICD-10-CM | POA: Diagnosis not present

## 2023-05-31 LAB — CBC WITH DIFFERENTIAL/PLATELET
Abs Immature Granulocytes: 0.04 10*3/uL (ref 0.00–0.07)
Basophils Absolute: 0.1 10*3/uL (ref 0.0–0.1)
Basophils Relative: 1 %
Eosinophils Absolute: 0.4 10*3/uL (ref 0.0–0.5)
Eosinophils Relative: 5 %
HCT: 31.5 % — ABNORMAL LOW (ref 36.0–46.0)
Hemoglobin: 10.3 g/dL — ABNORMAL LOW (ref 12.0–15.0)
Immature Granulocytes: 1 %
Lymphocytes Relative: 24 %
Lymphs Abs: 1.7 10*3/uL (ref 0.7–4.0)
MCH: 26.5 pg (ref 26.0–34.0)
MCHC: 32.7 g/dL (ref 30.0–36.0)
MCV: 81 fL (ref 80.0–100.0)
Monocytes Absolute: 0.5 10*3/uL (ref 0.1–1.0)
Monocytes Relative: 7 %
Neutro Abs: 4.4 10*3/uL (ref 1.7–7.7)
Neutrophils Relative %: 62 %
Platelets: 275 10*3/uL (ref 150–400)
RBC: 3.89 MIL/uL (ref 3.87–5.11)
RDW: 15.2 % (ref 11.5–15.5)
WBC: 7.1 10*3/uL (ref 4.0–10.5)
nRBC: 0 % (ref 0.0–0.2)

## 2023-05-31 LAB — CMV DNA, QUANTITATIVE, PCR
CMV DNA Quant: NEGATIVE IU/mL
Log10 CMV Qn DNA Pl: UNDETERMINED log10 IU/mL

## 2023-05-31 LAB — COMPREHENSIVE METABOLIC PANEL
ALT: 93 U/L — ABNORMAL HIGH (ref 0–44)
AST: 84 U/L — ABNORMAL HIGH (ref 15–41)
Albumin: 2.7 g/dL — ABNORMAL LOW (ref 3.5–5.0)
Alkaline Phosphatase: 289 U/L — ABNORMAL HIGH (ref 38–126)
Anion gap: 13 (ref 5–15)
BUN: 6 mg/dL — ABNORMAL LOW (ref 8–23)
CO2: 26 mmol/L (ref 22–32)
Calcium: 7.9 mg/dL — ABNORMAL LOW (ref 8.9–10.3)
Chloride: 98 mmol/L (ref 98–111)
Creatinine, Ser: 0.55 mg/dL (ref 0.44–1.00)
GFR, Estimated: >60 mL/min (ref >60)
Glucose, Bld: 93 mg/dL (ref 70–99)
Potassium: 2.9 mmol/L — ABNORMAL LOW (ref 3.5–5.1)
Sodium: 137 mmol/L (ref 135–145)
Total Bilirubin: 1.4 mg/dL — ABNORMAL HIGH (ref 0.3–1.2)
Total Protein: 6.7 g/dL (ref 6.5–8.1)

## 2023-05-31 LAB — HEPATITIS B E ANTIBODY: Hep B E Ab: NONREACTIVE

## 2023-05-31 LAB — PROTIME-INR
INR: 1.2 (ref 0.8–1.2)
Prothrombin Time: 15.2 seconds (ref 11.4–15.2)

## 2023-05-31 LAB — EPSTEIN BARR VRS(EBV DNA BY PCR): EBV DNA QN by PCR: NEGATIVE IU/mL

## 2023-05-31 LAB — HEPATITIS B E ANTIGEN: Hep B E Ag: NEGATIVE

## 2023-05-31 MED ORDER — POTASSIUM CHLORIDE CRYS ER 20 MEQ PO TBCR
40.0000 meq | EXTENDED_RELEASE_TABLET | ORAL | Status: AC
  Administered 2023-05-31 (×2): 40 meq via ORAL
  Filled 2023-05-31 (×2): qty 2

## 2023-05-31 NOTE — Plan of Care (Signed)

## 2023-05-31 NOTE — Progress Notes (Signed)
Patient has refused all neuro check attempts by this RN after code stroke was called and patient was brought back to her room. NP was made aware.

## 2023-05-31 NOTE — Progress Notes (Signed)
Initial Nutrition Assessment  DOCUMENTATION CODES:   Not applicable  INTERVENTION:  Ensure Enlive po BID, each supplement provides 350 kcal and 20 grams of protein.  Ice cream with meal trays    MVI po daily   Liberalize diet   Pt at high refeed risk; recommend monitor potassium, magnesium and phosphorus labs daily until stable  Daily weights   NUTRITION DIAGNOSIS:   Inadequate oral intake related to acute illness as evidenced by per patient/family report.  GOAL:   Patient will meet greater than or equal to 90% of their needs  MONITOR:   PO intake, Supplement acceptance, Labs, Weight trends, I & O's, Skin  REASON FOR ASSESSMENT:   Malnutrition Screening Tool    ASSESSMENT:   67 y.o. female with medical history significant of hypertension, hyperlipidemia and recent CVA s/p DAPT who admitted with intractable nausea, vomiting and transaminitis.  Met with pt in room today. Pt reports that she was in her normal state of health until she suffered from a stroke last month. Pt reports nausea, vomiting and weakness for the past 1-2 weeks. Pt reports that she has had very poor appetite and oral intake. Per chart, pt is down 12lbs(8%) over the past two weeks; this is significant weight loss. Pt reports that today that she feeling a little better. Pt denies any nausea or vomiting but reports ongoing poor oral intake. Pt reports that she did not eat breakfast or lunch today. Pt reports that she did drink some Ensure but reports that she really doesn't like it. RD discussed with pt the importance of adequate nutrition needed to preserve lean muscle. Pt was eating on some vanilla ice cream while RD was in the room. Pt is waiting on MRI today. Recommend continue Ensure supplements. RD will add ice cream to meal trays. Pt is at high refeed risk.   Medications reviewed and include: protonix, Kcl, NaCl @100ml /hr   Labs reviewed: K 2.9(L) Hgb 10.3(L), Hct 31.5(L)  NUTRITION - FOCUSED  PHYSICAL EXAM:  Flowsheet Row Most Recent Value  Orbital Region No depletion  Upper Arm Region No depletion  Thoracic and Lumbar Region No depletion  Temple Region No depletion  Clavicle Bone Region Mild depletion  Clavicle and Acromion Bone Region Mild depletion  Scapular Bone Region No depletion  Dorsal Hand No depletion  Patellar Region No depletion  Anterior Thigh Region No depletion  Posterior Calf Region No depletion  Edema (RD Assessment) Mild  Hair Reviewed  Eyes Reviewed  Mouth Reviewed  Skin Reviewed  Nails Reviewed   Diet Order:   Diet Order             Diet heart healthy/carb modified Room service appropriate? Yes; Fluid consistency: Thin  Diet effective now                  EDUCATION NEEDS:   Education needs have been addressed  Skin:  Skin Assessment: Reviewed RN Assessment (ecchymosis)  Last BM:  6/6- TYPE 4  Height:   Ht Readings from Last 1 Encounters:  05/29/23 5\' 3"  (1.6 m)    Weight:   Wt Readings from Last 1 Encounters:  05/29/23 67.1 kg    Ideal Body Weight:  52 kg  BMI:  Body mass index is 26.22 kg/m.  Estimated Nutritional Needs:   Kcal:  1500-1700kcal/day  Protein:  75-85g/day  Fluid:  1.4-1.6L/day  Betsey Holiday MS, RD, LDN Please refer to [facility] for RD and/or RD on-call/weekend/after hours pager

## 2023-05-31 NOTE — Progress Notes (Signed)
2034 - RN assessing patient for the shift. Patient was awake and alert when this RN began assessment. Patient only oriented to her name and the place. Patient unable to tell this RN her [place], age, what month it is, or her 2 granddaughter's names. This is a change in mentation from previous night and from last documented assessment.   Patient states she just woke up and she needs a minute before trying to answer questions. RN completed the rest of the assessment on the patient and obtained a set of vitals.   17 - RN reassessed after allowing the patient adequate time to wake up. Patient still only oriented to her name and place. Patient is still confused as to her 2 granddaughter's names and her 2 children's names.   2100 - Code stroke activated.

## 2023-05-31 NOTE — Progress Notes (Signed)
Wyline Mood , MD [address], [place], [place], [address] [address], [place], [place], [address] Phone: [phone]  Fax: [phone]   Briana Wright is being followed for abnormal lft Day 1 of follow up   Subjective: Has any abdominal pain nausea vomiting she states she is feeling better from the GI point of view.   Objective: Vital signs in last 24 hours: Vitals:   05/30/23 2041 05/30/23 2235 05/31/23 0104 05/31/23 0530  BP: (!) 159/85 (!) 160/89 (!) 161/94 (!) 159/85  Pulse: 67 67 65 (!) 55  Resp: 16 18 16 19   Temp: 98.4 F (36.9 C) 97.9 F (36.6 C)  97.9 F (36.6 C)  TempSrc:  Oral  Oral  SpO2: 100% 98% 100% 99%  Weight:      Height:       Weight change:   Intake/Output Summary (Last 24 hours) at 05/31/2023 0841 Last data filed at 05/31/2023 0500 Gross per 24 hour  Intake 2947.38 ml  Output --  Net 2947.38 ml     Exam:  Abdomen: soft, nontender, normal bowel sounds   Lab Results: @LABTEST2 @ Micro Results: No results found for this or any previous visit (from the past 240 hour(s)). Studies/Results: CT HEAD CODE STROKE WO CONTRAST  Result Date: 05/30/2023 CLINICAL DATA:  Code stroke. Initial evaluation for neuro deficit, stroke. EXAM: CT HEAD WITHOUT CONTRAST TECHNIQUE: Contiguous axial images were obtained from the base of the skull through the vertex without intravenous contrast. RADIATION DOSE REDUCTION: This exam was performed according to the departmental dose-optimization program which includes automated exposure control, adjustment of the mA and/or kV according to patient size and/or use of iterative reconstruction technique. COMPARISON:  None Available. FINDINGS: Brain: Cerebral volume within normal limits. Mild chronic microvascular ischemic disease noted. Irregular hypodensity involving the parasagittal left temporal occipital region, most characteristic of an evolving subacute left PCA distribution infarct. Superimposed  serpiginous hyperdensities within this area could reflect petechial hemorrhage and/or developing laminar necrosis (series 3, image 10). No frank intraparenchymal hemorrhage. No significant regional mass effect. No other acute intracranial hemorrhage or large vessel territory infarct. No mass lesion or midline shift. No extra-axial fluid collection. Vascular: No abnormal hyperdense vessel. Skull: Scalp soft tissues and calvarium demonstrate no acute finding. Sinuses/Orbits: Globes orbital soft tissues within normal limits. Mild scattered mucosal thickening with pneumatized secretions present at the sphenoethmoidal sinuses. Visualized paranasal sinuses are otherwise clear. No mastoid effusion. Other: None. ASPECTS [facility] Stroke Program Early CT Score) - Ganglionic level infarction (caudate, lentiform nuclei, internal capsule, insula, M1-M3 cortex): 7 - Supraganglionic infarction (M4-M6 cortex): 3 Total score (0-10 with 10 being normal): 10 IMPRESSION: 1. Irregular hypodensity involving the parasagittal left temporal occipital region, most characteristic of an evolving subacute left PCA distribution infarct. Superimposed serpiginous hyperdensities within this area could reflect petechial hemorrhage and/or developing laminar necrosis. No frank intraparenchymal hemorrhage. No significant regional mass effect. 2. No other acute intracranial abnormality. 3. Aspects is 10. 4. Underlying mild chronic microvascular ischemic disease. These results were communicated to Manuela Schwartz at 9:41 pm on 05/30/2023 by text page via the [facility] messaging system. Electronically Signed   By: Rise Mu M.D.   On: 05/30/2023 21:41   US LIVER DOPPLER LIMITED (PV OR SINGLE ART/VEIN)  Result Date: 05/30/2023 CLINICAL DATA:  5322 Portal vein thrombosis 5322 EXAM: DOPPLER ULTRASOUND OF LIVER TECHNIQUE: Color and duplex Doppler ultrasound was performed to evaluate the hepatic in-flow and out-flow vessels. COMPARISON:  US Abdomen,  05/29/2023  and 05/10/2023. CT AP, 05/29/2023. FINDINGS: Suboptimal evaluation, with poor acoustic penetration secondary to patient habitus and shadowing from overlying bowel gas. Liver: Normal parenchymal echogenicity. Normal hepatic contour without nodularity. No focal lesion, mass or intrahepatic biliary ductal dilatation. Main Portal Vein size: 1.0 cm Portal Vein Velocities Main Prox:  38 cm/sec Main Mid: 43 cm/sec Main Dist:  25 cm/sec Right: 17 cm/sec Left: 12 cm/sec Hepatic Vein Velocities: Obscured Portal Vein Occlusion/Thrombus: No Splenic Vein Occlusion/Thrombus: No Ascites: None Varices: None IMPRESSION: Suboptimal evaluation, within these constraints; 1. Patent portal vein, without Doppler evidence of thrombosis. 2. No acute sonographic findings within the abdomen. Roanna Banning, MD [facility] Radiology Electronically Signed   By: Roanna Banning M.D.   On: 05/30/2023 14:44   CT ABDOMEN PELVIS W CONTRAST  Result Date: 05/29/2023 CLINICAL DATA:  Abdominal pain EXAM: CT ABDOMEN AND PELVIS WITH CONTRAST TECHNIQUE: Multidetector CT imaging of the abdomen and pelvis was performed using the standard protocol following bolus administration of intravenous contrast. RADIATION DOSE REDUCTION: This exam was performed according to the departmental dose-optimization program which includes automated exposure control, adjustment of the mA and/or kV according to patient size and/or use of iterative reconstruction technique. CONTRAST:  OMNIPAQUE IOHEXOL 300 MG/ML  SOLN COMPARISON:  None Available. FINDINGS: Lower chest: No acute abnormality. Hepatobiliary: No focal liver abnormality is seen. No gallstones, gallbladder wall thickening, or biliary dilatation. Pancreas: Unremarkable. No pancreatic ductal dilatation or surrounding inflammatory changes. Spleen: Normal in size without focal abnormality. Adrenals/Urinary Tract: Bilateral adrenal glands are unremarkable. No  hydronephrosis or nephrolithiasis. Small bilateral low-attenuation renal lesions which are too small to accurately characterize but likely simple cysts, no specific follow-up imaging is recommended. Bladder is unremarkable. Stomach/Bowel: Stomach is within normal limits. No evidence of bowel wall thickening, distention, or inflammatory changes. Vascular/Lymphatic: Aortic atherosclerosis. No enlarged abdominal or pelvic lymph nodes. Reproductive: No adnexal masses. Other: No abdominal wall hernia or abnormality. No abdominopelvic ascites. Musculoskeletal: No acute or significant osseous findings. IMPRESSION: 1. No acute findings in the abdomen or pelvis. 2.  Aortic Atherosclerosis (ICD10-I70.0). Electronically Signed   By: Allegra Lai M.D.   On: 05/29/2023 10:15   Medications: I have reviewed the patient's current medications. Scheduled Meds:  aspirin  81 mg Oral Daily   carvedilol  6.25 mg Oral BID WC   cloNIDine  0.1 mg Oral Q12H   enoxaparin (LOVENOX) injection  40 mg Subcutaneous Q24H   feeding supplement  237 mL Oral BID BM   multivitamin with minerals  1 tablet Oral Daily   pantoprazole (PROTONIX) IV  40 mg Intravenous Q12H   Continuous Infusions:  sodium chloride 100 mL/hr at 05/30/23 2250   sodium chloride Stopped (05/30/23 2240)   PRN Meds:.ondansetron **OR** ondansetron (ZOFRAN) IV, senna-docusate     Latest Ref Rng & Units 05/31/2023   10:34 AM 05/30/2023    5:27 AM 05/29/2023    1:15 PM  Hepatic Function  Total Protein 6.5 - 8.1 g/dL 6.7  7.2  8.0   Albumin 3.5 - 5.0 g/dL 2.7  3.0  3.3   AST 15 - 41 U/L 84  103  131   ALT 0 - 44 U/L 93  119  142   Alk Phosphatase 38 - 126 U/L 289  363  420   Total Bilirubin 0.3 - 1.2 mg/dL 1.4  1.2  1.4   Bilirubin, Direct 0.0 - 0.2 mg/dL   0.3     Assessment: Principal Problem:  Intractable nausea and vomiting Active Problems:   History of left PCA stroke   Hyperlipemia   Transaminitis   Hypertension   Elevated LFTs  Briana Wright is a 67 y.o. y/o female presents with nausea, vomiting and abnormal LFT's . H/o CVA, differentials are infectious hepatitis vs ischemic (less likely as usually transaminases are much higher) or infection or medication related.  Tylenol levels normal range, Doppler ultrasound of the liver shows no evidence of portal vein thrombosis.  Overnight code stroke alert.  LFTs are improving   Plan 1.  Follow-up hepatitis panel for EBV HSV CMV VZV, daily CMP, daily INR 2.  From the GI point of view no further intervention can advance diet. 3.  If LFTs are improving tomorrow can go home and follow-up as an outpatient with primary care physician to check LFTs in a week or 10 days       LOS: 1 day   Wyline Mood, MD 05/31/2023, 8:41 AM

## 2023-05-31 NOTE — Progress Notes (Signed)
PT Cancellation Note  Patient Details Name: Briana Wright MRN: [id number] DOB: [birth year]   Cancelled Treatment:    Reason Eval/Treat Not Completed: Medical issues which prohibited therapy Patient had neurological change and has MRI planned. Will hold until she is medically cleared to continue with PT.   Karmello Abercrombie 05/31/2023, 10:51 AM

## 2023-05-31 NOTE — Progress Notes (Signed)
       CROSS COVER NOTE  NAME: Lonya Johannesen MRN: [id number] DOB : July 14, 1956    Concern as stated by nurse / staff   Informed of code s troke called on patient secondary to changes in neuro assessment     Pertinent findings on chart review: Prior stroke 5/4 -residual right eye vision deficit and mental confusion documented in PCP notes from [facility] asessment today concerned secondary to still having some evidence aphasia and possible peticcheal hmorrhage at old PCA stroke. Patient however refused perfusion studies.   Assessment and  Interventions   Assessment:  Plan: Hold asa Dose of lovenox also held MRI in AM per neuro rec Primary caregiver Earleen Reaper updated via phone infomred me of patient new dx cerebral and aortic arch aneurysm . h      Donnie Mesa NP [facility] Cross Cover 7pm-7am - check amion for availability Pager [phone]

## 2023-05-31 NOTE — Progress Notes (Signed)
OT Cancellation Note  Patient Details Name: Briana Wright MRN: [id number] DOB: Mar 23, 1956   Cancelled Treatment:    Reason Eval/Treat Not Completed: Medical issues which prohibited therapy.Patient had neurological change and has MRI planned. Will hold until she is medically cleared to continue with OT    Jackquline Denmark, MS, OTR/L , CBIS ascom [phone]  05/31/23, 11:47 AM

## 2023-05-31 NOTE — Progress Notes (Signed)
Progress Note   [name] WUJ:[id number] DOB: 1956-01-23 DOA: 05/29/2023     1 DOS: the patient was seen and examined on 05/31/2023     Subjective:  Patient seen and examined at bedside this morning She did complain of nausea but no vomiting According to her abdominal pain is improving Denies diarrhea chest pain or cough     Brief hospital course: From Dr. Alvester Morin HPI "Marvia Troost is a 67 y.o. female with medical history significant of hypertension, hyperlipidemia, CVA presenting with intractable nausea and vomiting, transaminitis.  Patient noted to have been recently admitted May 16 through May 20 for issues including hypokalemia in setting of decreased p.o. intake, AKI recent CVA status post DAPT treatment.  History primarily from patient's niece at the bedside.  Per report, patient with recurring episodes of nausea and vomiting over multiple days.  Has been unable to tolerate any solid or liquid intake.  Emesis nonbloody nonbilious.  Mild generalized abdominal pain.  No fevers or chills.  No nausea or vomiting.  No chest pain or shortness of breath.  Has been somewhat able to tolerate baby aspirin as well as oral medication regimen.  No recent antibiotic use.  No reported heavy NSAID use.  No reported diarrhea. Presented to the ER afebrile, hemodynamically stable.  White count 8.5, hemoglobin 12.8, platelets 357, creatinine 0.93, AST 157, ALT 157, alk phos 447, T. bili 2.2.  CT of the abdomen pelvis as well as right upper quadrant ultrasound grossly stable."      Assessment and Plan:  Acute metabolic encephalopathy to rule out CVA Apparently overnight patient was found to have had a change in her mental status and code stroke was called. Patient was seen by teleneurologist with recommendation to complete stroke workup and to hold onto aspirin and Lovenox on account of possible petechial hemorrhage on CT scan until MRI rules out any acute intracranial process. Statin being  held at this time on account of worsening liver function-we can come consider resumption tomorrow if liver function is improved Follow-up MRI of the brain ordered by me today Continue neurochecks I have discussed with patient's family    Intractable nausea and vomiting-improving Continue Protonix for possible underlying gastritis In the setting of transaminitis GI on board and case discussed Continue Zofran as needed for nausea and vomiting   Hypertension BP stable Continue current home medications    Severe life-threatening hypokalemia Potassium 2.9 today Continue repletion and monitoring  Transaminitis We will continue to monitor liver function test closely Gastroenterologist Dr. Tobi Bastos on board and case discussed Ultrasound reviewed by me did not show any masses GGT slightly elevated in the setting of elevated ALP  Liver function test reviewed by me shows improvement Is discussed with gastroenterologist  Follow-up on viral hepatic panel   History of left PCA stroke Recently on course of DAPT Holding on aspirin until hemorrhage ruled out on MRI of the brain     Advance Care Planning:   Code Status: Full Code    Consults: Dr. Tobi Bastos w/ GI.    Family Communication: Niece at the bedside        Physical Exam Constitutional:      Appearance: She is normal weight.  HENT: Atraumatic normocephalic Eyes:     Pupils: Pupils are equal, round, and reactive to light.  Cardiovascular:     Rate and Rhythm: Normal rate and regular rhythm.  Pulmonary:     Effort: Pulmonary effort is normal.  Abdominal:  General: Abdomen is flat.  Mid abdominal tenderness Musculoskeletal:        General: Normal range of motion.  Skin:    General: Skin is dry.  Neurological:     General: Oriented x 3 with no lateralizing signs   Psychiatric:        Mood and Affect: Mood normal.      Data Reviewed: I have reviewed patient's laboratory results showing sodium 137 potassium 2.9 which is  being repleted, AST and ALT improved today WBC 7.1 hemoglobin 10.3.  I personally reviewed patient's CT scan of the brain report that was obtained overnight and did not show any acute findings.  I have ordered MRI of the brain for follow-up and to complete stroke workup    Family Communication: Discussed with patient's family present at bedside   Vitals:   05/30/23 2041 05/30/23 2235 05/31/23 0104 05/31/23 0530  BP: (!) 159/85 (!) 160/89 (!) 161/94 (!) 159/85  Pulse: 67 67 65 (!) 55  Resp: 16 18 16 19   Temp: 98.4 F (36.9 C) 97.9 F (36.6 C)  97.9 F (36.6 C)  TempSrc:  Oral  Oral  SpO2: 100% 98% 100% 99%  Weight:      Height:         Author: Loyce Dys, MD 05/31/2023 2:59 PM  For on call review [email or web address].

## 2023-06-01 DIAGNOSIS — R112 Nausea with vomiting, unspecified: Secondary | ICD-10-CM | POA: Diagnosis not present

## 2023-06-01 LAB — COMPREHENSIVE METABOLIC PANEL
ALT: 93 U/L — ABNORMAL HIGH (ref 0–44)
AST: 88 U/L — ABNORMAL HIGH (ref 15–41)
Albumin: 3 g/dL — ABNORMAL LOW (ref 3.5–5.0)
Alkaline Phosphatase: 280 U/L — ABNORMAL HIGH (ref 38–126)
Anion gap: 15 (ref 5–15)
BUN: 6 mg/dL — ABNORMAL LOW (ref 8–23)
CO2: 26 mmol/L (ref 22–32)
Calcium: 8.3 mg/dL — ABNORMAL LOW (ref 8.9–10.3)
Chloride: 94 mmol/L — ABNORMAL LOW (ref 98–111)
Creatinine, Ser: 0.52 mg/dL (ref 0.44–1.00)
GFR, Estimated: >60 mL/min (ref >60)
Glucose, Bld: 87 mg/dL (ref 70–99)
Potassium: 3.4 mmol/L — ABNORMAL LOW (ref 3.5–5.1)
Sodium: 135 mmol/L (ref 135–145)
Total Bilirubin: 1.1 mg/dL (ref 0.3–1.2)
Total Protein: 6.8 g/dL (ref 6.5–8.1)

## 2023-06-01 LAB — CBC WITH DIFFERENTIAL/PLATELET
Abs Immature Granulocytes: 0.05 10*3/uL (ref 0.00–0.07)
Basophils Absolute: 0 10*3/uL (ref 0.0–0.1)
Basophils Relative: 0 %
Eosinophils Absolute: 0.4 10*3/uL (ref 0.0–0.5)
Eosinophils Relative: 6 %
HCT: 33.6 % — ABNORMAL LOW (ref 36.0–46.0)
Hemoglobin: 10.9 g/dL — ABNORMAL LOW (ref 12.0–15.0)
Immature Granulocytes: 1 %
Lymphocytes Relative: 30 %
Lymphs Abs: 2 10*3/uL (ref 0.7–4.0)
MCH: 26.5 pg (ref 26.0–34.0)
MCHC: 32.4 g/dL (ref 30.0–36.0)
MCV: 81.6 fL (ref 80.0–100.0)
Monocytes Absolute: 0.4 10*3/uL (ref 0.1–1.0)
Monocytes Relative: 6 %
Neutro Abs: 3.8 10*3/uL (ref 1.7–7.7)
Neutrophils Relative %: 57 %
Platelets: 307 10*3/uL (ref 150–400)
RBC: 4.12 MIL/uL (ref 3.87–5.11)
RDW: 15.3 % (ref 11.5–15.5)
WBC: 6.6 10*3/uL (ref 4.0–10.5)
nRBC: 0 % (ref 0.0–0.2)

## 2023-06-01 LAB — HEPATITIS B DNA, ULTRAQUANTITATIVE, PCR
HBV DNA SERPL PCR-ACNC: NOT DETECTED IU/mL
HBV DNA SERPL PCR-LOG IU: UNDETERMINED log10 IU/mL

## 2023-06-01 LAB — MAGNESIUM: Magnesium: 1.6 mg/dL — ABNORMAL LOW (ref 1.7–2.4)

## 2023-06-01 LAB — PHOSPHORUS: Phosphorus: 2.5 mg/dL (ref 2.5–4.6)

## 2023-06-01 MED ORDER — ASPIRIN 81 MG PO TBEC
81.0000 mg | DELAYED_RELEASE_TABLET | Freq: Every day | ORAL | Status: DC
  Administered 2023-06-01: 81 mg via ORAL
  Filled 2023-06-01: qty 1

## 2023-06-01 MED ORDER — MAGNESIUM SULFATE 2 GM/50ML IV SOLN
2.0000 g | Freq: Once | INTRAVENOUS | Status: AC
  Administered 2023-06-01: 2 g via INTRAVENOUS
  Filled 2023-06-01: qty 50

## 2023-06-01 MED ORDER — POTASSIUM CHLORIDE CRYS ER 20 MEQ PO TBCR
40.0000 meq | EXTENDED_RELEASE_TABLET | ORAL | Status: DC
  Administered 2023-06-01: 40 meq via ORAL
  Filled 2023-06-01: qty 2

## 2023-06-01 MED ORDER — ADULT MULTIVITAMIN W/MINERALS CH: 1.0000 | ORAL_TABLET | Freq: Every day | ORAL | 0 refills | Status: AC

## 2023-06-01 MED ORDER — ATORVASTATIN CALCIUM 10 MG PO TABS: 20.0000 mg | ORAL_TABLET | Freq: Every evening | ORAL | 0 refills | Status: DC

## 2023-06-01 MED ORDER — ONDANSETRON HCL 4 MG PO TABS: 4.0000 mg | ORAL_TABLET | Freq: Four times a day (QID) | ORAL | 0 refills | Status: DC | PRN

## 2023-06-01 MED ORDER — ATORVASTATIN CALCIUM 20 MG PO TABS
40.0000 mg | ORAL_TABLET | Freq: Every day | ORAL | Status: DC
  Administered 2023-06-01: 40 mg via ORAL
  Filled 2023-06-01: qty 2

## 2023-06-01 NOTE — Discharge Summary (Signed)
  Physician Discharge Summary   [name] MRN: [id number] DOB: 09-06-1956  Admit date:     05/29/2023  Discharge date: 06/01/23  Discharge Physician: Loyce Dys   PCP: Corky Downs, MD   Recommendations at discharge:   Follow-up with outpatient neurology as well as outpatient GI  Discharge Diagnoses: Acute metabolic encephalopathy to rule out CVA  Intractable nausea and vomiting-improving Hypertension Severe life-threatening hypokalemia Transaminitis-of unclear etiology History of left PCA stroke     Hospital Course: Briana Wright is a 67 y.o. female with medical history significant of hypertension, hyperlipidemia, CVA presenting with intractable nausea and vomiting, transaminitis.  Patient noted to have been recently admitted May 16 through May 20 for issues including hypokalemia in setting of decreased p.o. intake, AKI recent CVA status post DAPT treatment.  Presented to the ER afebrile, hemodynamically stable.  White count 8.5, hemoglobin 12.8, platelets 357, creatinine 0.93, AST 157, ALT 157, alk phos 447, T. bili 2.2.  CT of the abdomen pelvis as well as right upper quadrant ultrasound grossly stable. GGT was slightly elevated and so patient was seen by gastroenterologist.  Liver ultrasound was done which did not show any gross masses.  Liver function tests improved and viral hepatitis panel, hepatitis AMB negative as well as Epstein-Barr virus.  Patient had some neurologic changes and code stroke was called however no acute stroke findings detected according to discussion with neurologist today Dr. Selina Cooley.  According to neurologist patient has an outpatient appointment on 06/04/2023.  She was seen by PT OT ordered with recommendation for home health.  Given patient's improvement she is therefore being discharged today to follow-up with outpatient GI as well as outpatient neurologist.  Her dose of atorvastatin has been decreased from 80 to 20 in the setting of acute  transaminitis.     Consultants: Neurology, GI Procedures performed: None Disposition: Home health Diet recommendation:  Discharge Diet Orders (From admission, onward)     Start     Ordered   06/01/23 0000  Diet - low sodium heart healthy        06/01/23 1051           Cardiac diet DISCHARGE MEDICATION:    Follow-up Information     Schedule an appointment as soon as possible for a visit  with Wyline Mood, MD.   Specialty: Gastroenterology Contact information: [address] [place] [address] [phone]                Discharge Exam: Ceasar Mons Weights   05/29/23 0523 06/01/23 0500  Weight: 67.1 kg 63.3 kg    Appearance: She is normal weight.  HENT: Atraumatic normocephalic Eyes:     Pupils: Pupils are equal, round, and reactive to light.  Cardiovascular:     Rate and Rhythm: Normal rate and regular rhythm.  Pulmonary:     Effort: Pulmonary effort is normal.  Abdominal:     General: Abdomen is flat.  Mid abdominal tenderness Musculoskeletal:        General: Normal range of motion.  Skin:    General: Skin is dry.  Neurological:     General: Oriented x 3 with no lateralizing signs   Psychiatric:        Mood and Affect: Mood normal.   Condition at discharge: good   Discharge time spent: I spent 40 minutes discharging this patient  Signed: Loyce Dys, MD [facility] 06/01/2023

## 2023-06-01 NOTE — Progress Notes (Addendum)
Occupational Therapy Treatment Patient Details Name: Briana Wright MRN: [id number] DOB: [birth year] Today's Date: 06/01/2023   History of present illness 67 y.o. female with medical history significant of hypertension, hyperlipidemia, CVA presenting with intractable nausea and vomiting, transaminitis.  Patient noted to have been recently admitted May 16 through May 20 for issues including hypokalemia in setting of decreased p.o. intake, AKI recent CVA status post DAPT treatment.  History primarily from patient's niece at the bedside.  Per report, patient with recurring episodes of nausea and vomiting over multiple days.   OT comments  Pt demonstrates good progress today. She was able to eat solid food this AM and reports she is not having any nausea. Pt is able to perform transfers, ambulate with RW, perform toileting, complete grooming tasks standing at sink with no UE support on sink or cabinet, perform LB dressing, all with SUPV for safety and with good sitting and standing balance. Pt does move more slowly than usual and twice hits the wall with her RW, but overall displays good safety awareness and mobility. Recommend DC with HHOT, RW, and BSC.   Recommendations for follow up therapy are one component of a multi-disciplinary discharge planning process, led by the attending physician.  Recommendations may be updated based on patient status, additional functional criteria and insurance authorization.    Assistance Recommended at Discharge Intermittent Supervision/Assistance  Patient can return home with the following  A little help with walking and/or transfers;A little help with bathing/dressing/bathroom;Assistance with cooking/housework;Direct supervision/assist for medications management;Assist for transportation;Help with stairs or ramp for entrance   Equipment Recommendations  BSC/3in1;Other (comment) (RW)    Recommendations for Other Services      Precautions / Restrictions  Precautions Precautions: Fall Restrictions Weight Bearing Restrictions: No       Mobility Bed Mobility Overal bed mobility: Modified Independent Bed Mobility: Supine to Sit           General bed mobility comments: Patient able to sit to EOB with no assistance or supervision needed.    Transfers Overall transfer level: Needs assistance Equipment used: Rolling walker (2 wheels) Transfers: Sit to/from Stand Sit to Stand: Supervision     Step pivot transfers: Supervision           Balance Overall balance assessment: Independent Sitting-balance support: Bilateral upper extremity supported Sitting balance-Leahy Scale: Good     Standing balance support: During functional activity, Bilateral upper extremity supported, Reliant on assistive device for balance Standing balance-Leahy Scale: Fair                             ADL either performed or assessed with clinical judgement   ADL Overall ADL's : Needs assistance/impaired Eating/Feeding: Modified independent   Grooming: Wash/dry hands;Wash/dry face;Oral care;Standing;Supervision/safety               Lower Body Dressing: Modified independent;Sitting/lateral leans   Toilet Transfer: Supervision/safety;Rolling walker (2 wheels);Regular Toilet   Toileting- Clothing Manipulation and Hygiene: Modified independent              Extremity/Trunk Assessment Upper Extremity Assessment Upper Extremity Assessment: RUE deficits/detail RUE Deficits / Details: strength WFLs with decreased coordination (from CVA) RUE Sensation: WNL   Lower Extremity Assessment Lower Extremity Assessment: Overall WFL for tasks assessed   Cervical / Trunk Assessment Cervical / Trunk Assessment: Normal    Vision       Perception     Praxis      Cognition  Arousal/Alertness: Awake/alert Behavior During Therapy: WFL for tasks assessed/performed Overall Cognitive Status: Within Functional Limits for tasks assessed                                  General Comments: pt states she feels her memory/recall has been off since her CVA        Exercises Other Exercises Other Exercises: home safety, falls prevention, signs and symptoms of stroke and importance of seeking immediate medical attention    Shoulder Instructions       General Comments      Pertinent Vitals/ Pain       Pain Assessment Pain Assessment: No/denies pain  Home Living                                          Prior Functioning/Environment              Frequency  Min 2X/week        Progress Toward Goals  OT Goals(current goals can now be found in the care plan section)  Progress towards OT goals: Progressing toward goals  Acute Rehab OT Goals OT Goal Formulation: With patient Time For Goal Achievement: 06/13/23 Potential to Achieve Goals: Good  Plan Discharge plan remains appropriate;Frequency remains appropriate    Co-evaluation                 AM-PAC OT "6 Clicks" Daily Activity     Outcome Measure   Help from another person eating meals?: A Little Help from another person taking care of personal grooming?: A Little Help from another person toileting, which includes using toliet, bedpan, or urinal?: A Little Help from another person bathing (including washing, rinsing, drying)?: A Little Help from another person to put on and taking off regular upper body clothing?: A Little Help from another person to put on and taking off regular lower body clothing?: A Little 6 Click Score: 18    End of Session Equipment Utilized During Treatment: Rolling walker (2 wheels)  OT Visit Diagnosis: Unsteadiness on feet (R26.81);Other abnormalities of gait and mobility (R26.89);Muscle weakness (generalized) (M62.81)   Activity Tolerance Patient tolerated treatment well   Patient Left with call bell/phone within reach;in bed   Nurse Communication          Time: 1610-9604 OT  Time Calculation (min): 17 min  Charges: OT General Charges $OT Visit: 1 Visit OT Treatments $Self Care/Home Management : 8-22 mins Latina Craver, PhD, MS, OTR/L 06/01/23, 11:31 AM

## 2023-06-01 NOTE — TOC Initial Note (Signed)
Transition of Care [facility]) - Initial/Assessment Note    Patient Details  Name: Briana Wright MRN: [id number] Date of Birth: [birth year]  [facility]) CM/SW Contact:    Bing Quarry, RN Phone Number: 06/01/2023, 12:14 PM  Clinical Narrative: 06/01/23: Patient is discharging today. HH and DME orders requested. Patient has PCP, no issues with obtaining or paying for medications, has transportation via niece, Earleen Reaper, who checks on patient daily and provided this information. Lives with spouse at home, was caring for ill spouse pta.  HH PT/OT orders in with DME 3:1 and RW ordered. RN CM spoke with niece, was active with [facility] but does not want to resume with this agency. [employer] contacted for DME orders. Gabriel Cirri RN CM            Earleen Reaper (Niece)  [phone] (Mobile)        Patient Goals and CMS Choice            Expected Discharge Plan and Services         Expected Discharge Date: 06/01/23                                    Prior Living Arrangements/Services                       Activities of Daily Living Home Assistive Devices/Equipment: None ADL Screening (condition at time of admission) Patient's cognitive ability adequate to safely complete daily activities?: Yes Is the patient deaf or have difficulty hearing?: No Does the patient have difficulty seeing, even when wearing glasses/contacts?: No Does the patient have difficulty concentrating, remembering, or making decisions?: No Patient able to express need for assistance with ADLs?: Yes Does the patient have difficulty dressing or bathing?: Yes Independently performs ADLs?: No Communication: Independent Dressing (OT): Needs assistance Is this a change from baseline?: Pre-admission baseline Grooming: Independent Feeding: Independent Bathing: Needs assistance Is this a change from baseline?: Pre-admission baseline Toileting: Needs assistance Is this a change from  baseline?: Pre-admission baseline In/Out Bed: Needs assistance Is this a change from baseline?: Pre-admission baseline Walks in Home: Needs assistance Is this a change from baseline?: Pre-admission baseline Does the patient have difficulty walking or climbing stairs?: Yes Weakness of Legs: Right Weakness of Arms/Hands: Right  Permission Sought/Granted                  Emotional Assessment              Admission diagnosis:  Hyperbilirubinemia [E80.6] Elevated LFTs [R79.89] Intractable nausea and vomiting [R11.2] Nausea and vomiting, unspecified vomiting type [R11.2] Patient Active Problem List   Diagnosis Date Noted   Elevated LFTs 05/30/2023   Intractable nausea and vomiting 05/29/2023   Transaminitis 05/29/2023   Hypertension 05/29/2023   Hyponatremia 05/09/2023   Hyperkalemia 05/09/2023   AKI (acute kidney injury) (HCC) 05/09/2023   History of left PCA stroke 05/09/2023   Hyperlipemia 05/09/2023   Diarrhea 05/09/2023   Leukocytosis 05/09/2023   Weakness 05/09/2023   Chest pain 10/07/2015   PCP:  Corky Downs, MD Pharmacy:   [facility] [place], [place] - [address] [place] [address] Phone: [phone] Fax: [phone]     Social Determinants of Health (SDOH) Social History: SDOH Screenings   Food Insecurity: No Food Insecurity (05/29/2023)  Housing: Low Risk  (05/29/2023)  Transportation Needs:  No Transportation Needs (05/29/2023)  Utilities: Not At Risk (05/29/2023)  Tobacco Use: High Risk (05/31/2023)   SDOH Interventions:     Readmission Risk Interventions    06/01/2023   12:05 PM  Readmission Risk Prevention Plan  Post Dischage Appt Complete  Medication Screening Complete  Transportation Screening Complete

## 2023-06-01 NOTE — TOC Transition Note (Addendum)
Transition of Care [facility]) - CM/SW Discharge Note   Patient Details  Name: Briana Wright MRN: [id number] Date of Birth: 28-Dec-1955  Transition of Care [facility]) CM/SW Contact:  Bing Quarry, RN Phone Number: 06/01/2023, 1:27 PM   Clinical Narrative:  6/8: Discharge orders are in. [facility] arranged via [facility]. DME via [employer] to be delivered to room. Niece to transport home. Gabriel Cirri RN CM   Update after discharge: Patient has pending neurology follow up on 06/04/23. Provider contacted neurologist on call for further details to pass on to family. They will need to call Monday to confirm location of office/clinic follow up as provider sees patients at multiple sites. Family notified via [employer].    Mabeline Caras, DO Neurology NPI: [id number] [phone] [place] [address]  Phone: [phone] Fax: [phone] Gabriel Cirri RN CM   Final next level of care: Home w Home Health Services Barriers to Discharge: Barriers Resolved   Patient Goals and CMS Choice      Discharge Placement                         Discharge Plan and Services Additional resources added to the After Visit Summary for                  DME Arranged: 3-N-1, Walker rolling DME Agency: [employer] Date DME Agency Contacted: 06/01/23 Time DME Agency Contacted: 10 Representative spoke with at DME Agency: Leavy Cella HH Arranged: PT, OT, RN [facility] Agency: [facility] Date [facility] Agency Contacted: 06/01/23 Time HH Agency Contacted: 1327 Representative spoke with at [facility] Agency: Barnetta Chapel.  Social Determinants of Health (SDOH) Interventions SDOH Screenings   Food Insecurity: No Food Insecurity (05/29/2023)  Housing: Low Risk  (05/29/2023)  Transportation Needs: No Transportation Needs (05/29/2023)  Utilities: Not At Risk (05/29/2023)  Tobacco Use: High Risk (05/31/2023)     Readmission Risk Interventions    06/01/2023   12:05 PM  Readmission Risk Prevention Plan   Post Dischage Appt Complete  Medication Screening Complete  Transportation Screening Complete

## 2023-06-01 NOTE — Progress Notes (Signed)
    Durable Medical Equipment  (From admission, onward)           Start     Ordered   06/01/23 1217  For home use only DME Walker rolling  Once       Question Answer Comment  Walker: With 5 Inch Wheels   Patient needs a walker to treat with the following condition Ambulatory dysfunction      06/01/23 1216   06/01/23 1217  For home use only DME Bedside commode  Once       Question:  Patient needs a bedside commode to treat with the following condition  Answer:  Ambulatory dysfunction   06/01/23 1217   06/01/23 1216  For home use only DME 3 n 1  Once       Comments: Bedside commode and rolling walker   06/01/23 1216   05/30/23 1507  For home use only DME Walker rolling  Once       Question Answer Comment  Walker: With 5 Inch Wheels   Patient needs a walker to treat with the following condition Weakness generalized      05/30/23 1507           Note: A BSC is sufficient for this patient's needs. Gabriel Cirri RN CM upon approval from provider via co-sign.

## 2023-06-02 LAB — HSV DNA BY PCR (REFERENCE LAB)
HSV 1 DNA: NEGATIVE
HSV 2 DNA: NEGATIVE

## 2023-08-20 ENCOUNTER — Emergency Department
Admission: EM | Admit: 2023-08-20 | Discharge: 2023-08-20 | Disposition: A | Discharge status: Home / Self Care | Payer: Medicare Other | Attending: Emergency Medicine | Admitting: Emergency Medicine

## 2023-08-20 ENCOUNTER — Emergency Department: Payer: Medicare Other

## 2023-08-20 ENCOUNTER — Other Ambulatory Visit: Payer: Self-pay

## 2023-08-20 DIAGNOSIS — R1110 Vomiting, unspecified: Secondary | ICD-10-CM | POA: Diagnosis present

## 2023-08-20 DIAGNOSIS — R1013 Epigastric pain: Secondary | ICD-10-CM | POA: Diagnosis not present

## 2023-08-20 DIAGNOSIS — I10 Essential (primary) hypertension: Secondary | ICD-10-CM | POA: Diagnosis not present

## 2023-08-20 DIAGNOSIS — E860 Dehydration: Secondary | ICD-10-CM

## 2023-08-20 DIAGNOSIS — R112 Nausea with vomiting, unspecified: Secondary | ICD-10-CM

## 2023-08-20 DIAGNOSIS — Z8673 Personal history of transient ischemic attack (TIA), and cerebral infarction without residual deficits: Secondary | ICD-10-CM | POA: Insufficient documentation

## 2023-08-20 LAB — COMPREHENSIVE METABOLIC PANEL
ALT: 16 U/L (ref 0–44)
AST: 22 U/L (ref 15–41)
Albumin: 5 g/dL (ref 3.5–5.0)
Alkaline Phosphatase: 129 U/L — ABNORMAL HIGH (ref 38–126)
Anion gap: 15 (ref 5–15)
BUN: 61 mg/dL — ABNORMAL HIGH (ref 8–23)
CO2: 21 mmol/L — ABNORMAL LOW (ref 22–32)
Calcium: 9.8 mg/dL (ref 8.9–10.3)
Chloride: 99 mmol/L (ref 98–111)
Creatinine, Ser: 1.45 mg/dL — ABNORMAL HIGH (ref 0.44–1.00)
GFR, Estimated: 39 mL/min — ABNORMAL LOW (ref >60)
Glucose, Bld: 104 mg/dL — ABNORMAL HIGH (ref 70–99)
Potassium: 4.1 mmol/L (ref 3.5–5.1)
Sodium: 135 mmol/L (ref 135–145)
Total Bilirubin: 1.2 mg/dL (ref 0.3–1.2)
Total Protein: 9.7 g/dL — ABNORMAL HIGH (ref 6.5–8.1)

## 2023-08-20 LAB — CBC
HCT: 39.9 % (ref 36.0–46.0)
Hemoglobin: 12.9 g/dL (ref 12.0–15.0)
MCH: 26.9 pg (ref 26.0–34.0)
MCHC: 32.3 g/dL (ref 30.0–36.0)
MCV: 83.1 fL (ref 80.0–100.0)
Platelets: 395 10*3/uL (ref 150–400)
RBC: 4.8 MIL/uL (ref 3.87–5.11)
RDW: 15.4 % (ref 11.5–15.5)
WBC: 11.6 10*3/uL — ABNORMAL HIGH (ref 4.0–10.5)
nRBC: 0 % (ref 0.0–0.2)

## 2023-08-20 LAB — LIPASE, BLOOD: Lipase: 35 U/L (ref 11–51)

## 2023-08-20 MED ORDER — SODIUM CHLORIDE 0.9 % IV BOLUS
1000.0000 mL | Freq: Once | INTRAVENOUS | Status: AC
  Administered 2023-08-20: 1000 mL via INTRAVENOUS

## 2023-08-20 MED ORDER — DROPERIDOL 2.5 MG/ML IJ SOLN
2.5000 mg | Freq: Once | INTRAMUSCULAR | Status: AC
  Administered 2023-08-20: 2.5 mg via INTRAVENOUS
  Filled 2023-08-20: qty 2

## 2023-08-20 MED ORDER — IOHEXOL 300 MG/ML  SOLN
80.0000 mL | Freq: Once | INTRAMUSCULAR | Status: AC | PRN
  Administered 2023-08-20: 80 mL via INTRAVENOUS

## 2023-08-20 MED ORDER — HYDRALAZINE HCL 20 MG/ML IJ SOLN
20.0000 mg | Freq: Once | INTRAMUSCULAR | Status: AC
  Administered 2023-08-20: 20 mg via INTRAVENOUS
  Filled 2023-08-20: qty 1

## 2023-08-20 MED ORDER — ONDANSETRON HCL 4 MG/2ML IJ SOLN
4.0000 mg | Freq: Once | INTRAMUSCULAR | Status: AC
  Administered 2023-08-20: 4 mg via INTRAVENOUS
  Filled 2023-08-20: qty 2

## 2023-08-20 NOTE — ED Notes (Signed)
Pt verbalizes understanding of discharge instructions. Opportunity for questioning and answers were provided. Pt discharged from ED to home with family.    

## 2023-08-20 NOTE — ED Provider Notes (Signed)
Care of this patient assumed from prior physician at 1500 pending CT, PO trial if reassuring, and disposition. Please see prior physician note for further details.  Briefly this is a 67 year old female presenting with 3 days of vomiting.  Hypertensive on presentation, but in the setting of inability to take her antihypertensive medications. Labs notable for AKI with a creatinine of 1.45.  CT abdomen pelvis without evidence of bowel obstruction or other acute findings.  Patient reports improvement in her symptoms on reevaluation.  She was p.o. trialed which she tolerated without recurrent nausea or vomiting.  Patient has been are comfortable with discharge home.  Did discuss the need for close outpatient follow-up and likely repeat blood work to monitor her creatinine.  Will DC with prescription for Zofran.  Strict return precautions provided.  Patient discharged in stable condition.   Trinna Post, MD 08/20/23 [phone]

## 2023-08-20 NOTE — ED Notes (Signed)
MD aware of BP

## 2023-08-20 NOTE — ED Notes (Signed)
CT tech reports inability to complete scan due to inadequate IV access. IV team consult placed at this time. Family at bedside. Pt alert. NAD.

## 2023-08-20 NOTE — Discharge Instructions (Addendum)
You were seen in the emergency department today for your nausea and vomiting.  Your testing was overall reassuring.  I sent a prescription for nausea medicine to your pharmacy that you can take as needed.  Please follow-up closely with your primary care doctor for further evaluation and likely repeat blood testing of your kidney function.  Please also make sure you start taking your blood pressure medication again as directed.  Return to the ER for new or worsening symptoms.

## 2023-08-20 NOTE — ED Triage Notes (Signed)
Pt to ED for emesis x3 days. +chills. States has been throwing up bp meds.

## 2023-08-20 NOTE — ED Notes (Signed)
Pt ambulated to restroom x1 assist. Unable to collect urine sample due to patient missing hat in toilet.

## 2023-08-20 NOTE — ED Provider Notes (Signed)
[facility] Provider Note   Event Date/Time   First MD Initiated Contact with Patient 08/20/23 1051     (approximate) History  Emesis  HPI Briana Wright is a 67 y.o. female with a stated past medical history of hypertension and CVA with right-sided weakness persistently who presents for vomiting over the last 3 days with associated hypertension due to inability to take her antihypertensive medications.  Patient states that she has not had any new medication changes, no recent travel or sick contacts. Patient denies any significant abdominal pain but does endorse epigastric discomfort. ROS: Patient currently denies any vision changes, tinnitus, difficulty speaking, facial droop, sore throat, chest pain, shortness of breath, diarrhea, dysuria, or weakness/numbness/paresthesias in any extremity   Physical Exam  Triage Vital Signs: ED Triage Vitals  Encounter Vitals Group     BP 08/20/23 1037 (!) 213/113     Systolic BP Percentile --      Diastolic BP Percentile --      Pulse Rate 08/20/23 1037 83     Resp 08/20/23 1037 18     Temp 08/20/23 1037 98.2 F (36.8 C)     Temp src --      SpO2 08/20/23 1037 100 %     Weight 08/20/23 1036 134 lb (60.8 kg)     Height 08/20/23 1036 5\' 5"  (1.651 m)     Head Circumference --      Peak Flow --      Pain Score 08/20/23 1036 0     Pain Loc --      Pain Education --      Exclude from Growth Chart --    Most recent vital signs: Vitals:   08/20/23 1251 08/20/23 1505  BP: (!) 142/81 (!) 149/92  Pulse: 90 92  Resp: 18 18  Temp:    SpO2: 100% 100%   General: Awake, oriented x4. CV:  Good peripheral perfusion.  Resp:  Normal effort.  Abd:  No distention.  Other:  Elderly well-developed, well-nourished African-American female resting comfortably in no acute distress ED Results / Procedures / Treatments  Labs (all labs ordered are listed, but only abnormal results are displayed) Labs Reviewed  COMPREHENSIVE  METABOLIC PANEL - Abnormal; Notable for the following components:      Result Value   CO2 21 (*)    Glucose, Bld 104 (*)    BUN 61 (*)    Creatinine, Ser 1.45 (*)    Total Protein 9.7 (*)    Alkaline Phosphatase 129 (*)    GFR, Estimated 39 (*)    All other components within normal limits  CBC - Abnormal; Notable for the following components:   WBC 11.6 (*)    All other components within normal limits  LIPASE, BLOOD  URINALYSIS, ROUTINE W REFLEX MICROSCOPIC   EKG ED ECG REPORT I, Merwyn Katos, the attending physician, personally viewed and interpreted this ECG. Date: 08/20/2023 EKG Time: 1045 Rate: 76 Rhythm: normal sinus rhythm QRS Axis: normal Intervals: normal ST/T Wave abnormalities: normal Narrative Interpretation: no evidence of acute ischemia RADIOLOGY ED MD interpretation: Pending -Agree with radiology assessment Official radiology report(s): No results found. PROCEDURES: Critical Care performed: No Procedures MEDICATIONS ORDERED IN ED: Medications  hydrALAZINE (APRESOLINE) injection 20 mg (20 mg Intravenous Given 08/20/23 1126)  ondansetron (ZOFRAN) injection 4 mg (4 mg Intravenous Given 08/20/23 1125)  droperidol (INAPSINE) 2.5 MG/ML injection 2.5 mg (2.5 mg Intravenous Given 08/20/23 1324)  iohexol (OMNIPAQUE) 300 MG/ML solution  80 mL (80 mLs Intravenous Contrast Given 08/20/23 1231)  sodium chloride 0.9 % bolus 1,000 mL (1,000 mLs Intravenous New Bag/Given 08/20/23 1329)   IMPRESSION / MDM / ASSESSMENT AND PLAN / ED COURSE  I reviewed the triage vital signs and the nursing notes.                             The patient is on the cardiac monitor to evaluate for evidence of arrhythmia and/or significant heart rate changes. Patient's presentation is most consistent with acute presentation with potential threat to life or bodily function. Patient's symptoms not typical for emergent causes of abdominal pain such as, but not limited to, appendicitis, abdominal  aortic aneurysm, surgical biliary disease, pancreatitis, SBO, mesenteric ischemia, serious intra-abdominal bacterial illness. Presentation also not typical of gynecologic emergencies such as TOA, Ovarian Torsion, PID. Not Ectopic. Doubt atypical ACS. Care of this patient will be signed out to the oncoming physician at the end of my shift.  All pertinent patient information conveyed and all questions answered.  All further care and disposition decisions will be made by the oncoming physician.   FINAL CLINICAL IMPRESSION(S) / ED DIAGNOSES   Final diagnoses:  None   Rx / DC Orders   ED Discharge Orders     None      Note:  This document was prepared using Dragon voice recognition software and may include unintentional dictation errors.   Merwyn Katos, MD 08/20/23 [phone]

## 2023-08-28 ENCOUNTER — Inpatient Hospital Stay
Admission: EM | Admit: 2023-08-28 | Discharge: 2023-09-02 | DRG: 391 | Disposition: A | Discharge status: Home / Self Care | Payer: Medicare Other | Attending: Internal Medicine | Admitting: Internal Medicine

## 2023-08-28 ENCOUNTER — Inpatient Hospital Stay: Payer: Medicare Other

## 2023-08-28 ENCOUNTER — Other Ambulatory Visit: Payer: Self-pay

## 2023-08-28 DIAGNOSIS — K219 Gastro-esophageal reflux disease without esophagitis: Secondary | ICD-10-CM | POA: Diagnosis present

## 2023-08-28 DIAGNOSIS — I10 Essential (primary) hypertension: Secondary | ICD-10-CM | POA: Diagnosis present

## 2023-08-28 DIAGNOSIS — F17200 Nicotine dependence, unspecified, uncomplicated: Secondary | ICD-10-CM | POA: Diagnosis present

## 2023-08-28 DIAGNOSIS — E860 Dehydration: Secondary | ICD-10-CM | POA: Diagnosis present

## 2023-08-28 DIAGNOSIS — E872 Acidosis, unspecified: Secondary | ICD-10-CM | POA: Diagnosis not present

## 2023-08-28 DIAGNOSIS — E875 Hyperkalemia: Secondary | ICD-10-CM | POA: Diagnosis present

## 2023-08-28 DIAGNOSIS — K2970 Gastritis, unspecified, without bleeding: Secondary | ICD-10-CM | POA: Diagnosis not present

## 2023-08-28 DIAGNOSIS — N179 Acute kidney failure, unspecified: Secondary | ICD-10-CM | POA: Diagnosis present

## 2023-08-28 DIAGNOSIS — K3182 Dieulafoy lesion (hemorrhagic) of stomach and duodenum: Secondary | ICD-10-CM | POA: Diagnosis present

## 2023-08-28 DIAGNOSIS — K2990 Gastroduodenitis, unspecified, without bleeding: Secondary | ICD-10-CM | POA: Diagnosis present

## 2023-08-28 DIAGNOSIS — Z90710 Acquired absence of both cervix and uterus: Secondary | ICD-10-CM | POA: Diagnosis not present

## 2023-08-28 DIAGNOSIS — Z79899 Other long term (current) drug therapy: Secondary | ICD-10-CM | POA: Diagnosis not present

## 2023-08-28 DIAGNOSIS — Z7982 Long term (current) use of aspirin: Secondary | ICD-10-CM

## 2023-08-28 DIAGNOSIS — Z8673 Personal history of transient ischemic attack (TIA), and cerebral infarction without residual deficits: Secondary | ICD-10-CM

## 2023-08-28 DIAGNOSIS — I69351 Hemiplegia and hemiparesis following cerebral infarction affecting right dominant side: Secondary | ICD-10-CM

## 2023-08-28 DIAGNOSIS — E785 Hyperlipidemia, unspecified: Secondary | ICD-10-CM | POA: Diagnosis present

## 2023-08-28 DIAGNOSIS — R531 Weakness: Secondary | ICD-10-CM

## 2023-08-28 DIAGNOSIS — R1110 Vomiting, unspecified: Principal | ICD-10-CM

## 2023-08-28 DIAGNOSIS — F17210 Nicotine dependence, cigarettes, uncomplicated: Secondary | ICD-10-CM | POA: Diagnosis present

## 2023-08-28 DIAGNOSIS — Z1152 Encounter for screening for COVID-19: Secondary | ICD-10-CM | POA: Diagnosis not present

## 2023-08-28 DIAGNOSIS — E8729 Other acidosis: Secondary | ICD-10-CM | POA: Diagnosis not present

## 2023-08-28 DIAGNOSIS — K259 Gastric ulcer, unspecified as acute or chronic, without hemorrhage or perforation: Secondary | ICD-10-CM | POA: Diagnosis present

## 2023-08-28 DIAGNOSIS — E876 Hypokalemia: Secondary | ICD-10-CM | POA: Diagnosis present

## 2023-08-28 DIAGNOSIS — K3189 Other diseases of stomach and duodenum: Secondary | ICD-10-CM | POA: Diagnosis present

## 2023-08-28 DIAGNOSIS — R112 Nausea with vomiting, unspecified: Secondary | ICD-10-CM | POA: Diagnosis present

## 2023-08-28 DIAGNOSIS — D72829 Elevated white blood cell count, unspecified: Secondary | ICD-10-CM | POA: Diagnosis present

## 2023-08-28 LAB — COMPREHENSIVE METABOLIC PANEL
ALT: 11 U/L (ref 0–44)
AST: 21 U/L (ref 15–41)
Albumin: 4.4 g/dL (ref 3.5–5.0)
Alkaline Phosphatase: 95 U/L (ref 38–126)
Anion gap: 17 — ABNORMAL HIGH (ref 5–15)
BUN: 95 mg/dL — ABNORMAL HIGH (ref 8–23)
CO2: 22 mmol/L (ref 22–32)
Calcium: 9.7 mg/dL (ref 8.9–10.3)
Chloride: 97 mmol/L — ABNORMAL LOW (ref 98–111)
Creatinine, Ser: 2.65 mg/dL — ABNORMAL HIGH (ref 0.44–1.00)
GFR, Estimated: 19 mL/min — ABNORMAL LOW (ref >60)
Glucose, Bld: 89 mg/dL (ref 70–99)
Potassium: 5.3 mmol/L — ABNORMAL HIGH (ref 3.5–5.1)
Sodium: 136 mmol/L (ref 135–145)
Total Bilirubin: 0.9 mg/dL (ref 0.3–1.2)
Total Protein: 8.8 g/dL — ABNORMAL HIGH (ref 6.5–8.1)

## 2023-08-28 LAB — CBC WITH DIFFERENTIAL/PLATELET
Abs Immature Granulocytes: 0.05 10*3/uL (ref 0.00–0.07)
Basophils Absolute: 0 10*3/uL (ref 0.0–0.1)
Basophils Relative: 0 %
Eosinophils Absolute: 0.1 10*3/uL (ref 0.0–0.5)
Eosinophils Relative: 1 %
HCT: 40.5 % (ref 36.0–46.0)
Hemoglobin: 12.8 g/dL (ref 12.0–15.0)
Immature Granulocytes: 0 %
Lymphocytes Relative: 17 %
Lymphs Abs: 1.9 10*3/uL (ref 0.7–4.0)
MCH: 26.6 pg (ref 26.0–34.0)
MCHC: 31.6 g/dL (ref 30.0–36.0)
MCV: 84.2 fL (ref 80.0–100.0)
Monocytes Absolute: 1 10*3/uL (ref 0.1–1.0)
Monocytes Relative: 9 %
Neutro Abs: 8.5 10*3/uL — ABNORMAL HIGH (ref 1.7–7.7)
Neutrophils Relative %: 73 %
Platelets: 321 10*3/uL (ref 150–400)
RBC: 4.81 MIL/uL (ref 3.87–5.11)
RDW: 15.4 % (ref 11.5–15.5)
WBC: 11.6 10*3/uL — ABNORMAL HIGH (ref 4.0–10.5)
nRBC: 0 % (ref 0.0–0.2)

## 2023-08-28 LAB — LIPASE, BLOOD: Lipase: 102 U/L — ABNORMAL HIGH (ref 11–51)

## 2023-08-28 LAB — SARS CORONAVIRUS 2 BY RT PCR: SARS Coronavirus 2 by RT PCR: NEGATIVE

## 2023-08-28 MED ORDER — LACTATED RINGERS IV BOLUS
1000.0000 mL | Freq: Once | INTRAVENOUS | Status: AC
  Administered 2023-08-28: 1000 mL via INTRAVENOUS

## 2023-08-28 MED ORDER — METOCLOPRAMIDE HCL 5 MG/ML IJ SOLN
10.0000 mg | INTRAMUSCULAR | Status: AC
  Administered 2023-08-28: 10 mg via INTRAVENOUS
  Filled 2023-08-28: qty 2

## 2023-08-28 MED ORDER — ONDANSETRON HCL 4 MG PO TABS
4.0000 mg | ORAL_TABLET | Freq: Four times a day (QID) | ORAL | Status: AC | PRN
  Filled 2023-08-28: qty 1

## 2023-08-28 MED ORDER — ATORVASTATIN CALCIUM 20 MG PO TABS
20.0000 mg | ORAL_TABLET | Freq: Every day | ORAL | Status: DC
  Administered 2023-08-28 – 2023-09-01 (×3): 20 mg via ORAL
  Filled 2023-08-28 (×3): qty 1

## 2023-08-28 MED ORDER — HYDRALAZINE HCL 20 MG/ML IJ SOLN: 5.0000 mg | Freq: Three times a day (TID) | INTRAMUSCULAR | Status: DC | PRN

## 2023-08-28 MED ORDER — ONDANSETRON HCL 4 MG/2ML IJ SOLN
4.0000 mg | Freq: Once | INTRAMUSCULAR | Status: AC
  Administered 2023-08-28: 4 mg via INTRAVENOUS
  Filled 2023-08-28: qty 2

## 2023-08-28 MED ORDER — ASPIRIN 81 MG PO CHEW
81.0000 mg | CHEWABLE_TABLET | Freq: Every day | ORAL | Status: DC
  Administered 2023-08-29 – 2023-09-02 (×4): 81 mg via ORAL
  Filled 2023-08-28 (×4): qty 1

## 2023-08-28 MED ORDER — ADULT MULTIVITAMIN W/MINERALS CH
1.0000 | ORAL_TABLET | Freq: Every day | ORAL | Status: DC
  Administered 2023-08-29 – 2023-09-02 (×3): 1 via ORAL
  Filled 2023-08-28 (×4): qty 1

## 2023-08-28 MED ORDER — PANTOPRAZOLE SODIUM 40 MG PO TBEC
40.0000 mg | DELAYED_RELEASE_TABLET | Freq: Every day | ORAL | Status: DC
  Administered 2023-08-29: 40 mg via ORAL
  Filled 2023-08-28: qty 1

## 2023-08-28 MED ORDER — SENNOSIDES-DOCUSATE SODIUM 8.6-50 MG PO TABS: 1.0000 | ORAL_TABLET | Freq: Every evening | ORAL | Status: DC | PRN

## 2023-08-28 MED ORDER — ONDANSETRON HCL 4 MG/2ML IJ SOLN
4.0000 mg | Freq: Four times a day (QID) | INTRAMUSCULAR | Status: AC | PRN
  Administered 2023-08-30 – 2023-09-01 (×5): 4 mg via INTRAVENOUS
  Filled 2023-08-28 (×5): qty 2

## 2023-08-28 MED ORDER — PANTOPRAZOLE SODIUM 40 MG IV SOLR
40.0000 mg | Freq: Once | INTRAVENOUS | Status: AC
  Administered 2023-08-28: 40 mg via INTRAVENOUS
  Filled 2023-08-28: qty 10

## 2023-08-28 MED ORDER — ACETAMINOPHEN 650 MG RE SUPP: 650.0000 mg | Freq: Four times a day (QID) | RECTAL | Status: AC | PRN

## 2023-08-28 MED ORDER — ACETAMINOPHEN 325 MG PO TABS
650.0000 mg | ORAL_TABLET | Freq: Four times a day (QID) | ORAL | Status: AC | PRN
  Administered 2023-09-01: 650 mg via ORAL
  Filled 2023-08-28: qty 2

## 2023-08-28 MED ORDER — HEPARIN SODIUM (PORCINE) 5000 UNIT/ML IJ SOLN
5000.0000 [IU] | Freq: Three times a day (TID) | INTRAMUSCULAR | Status: DC
  Administered 2023-08-28 – 2023-09-02 (×14): 5000 [IU] via SUBCUTANEOUS
  Filled 2023-08-28 (×15): qty 1

## 2023-08-28 MED ORDER — SENNOSIDES-DOCUSATE SODIUM 8.6-50 MG PO TABS
1.0000 | ORAL_TABLET | Freq: Every evening | ORAL | Status: DC | PRN
  Filled 2023-08-28: qty 1

## 2023-08-28 MED ORDER — SODIUM CHLORIDE 0.9 % IV SOLN: INTRAVENOUS | Status: AC

## 2023-08-28 NOTE — Assessment & Plan Note (Signed)
Fall precautions.  

## 2023-08-28 NOTE — Hospital Course (Addendum)
Briana Wright is a 67 year old female with history of hypertension, hyperlipidemia, GERD, history of CVA, who presents to the emergency department for chief concerns of nausea, vomiting for 2 weeks.  Vitals in the ED  showed respiration rate of 15, heart rate of 66, blood pressure 89/67, SpO2 of 100% on room air.  Temperature was 98.5  Serum sodium is 136, potassium 5.3, chloride 97, bicarb 22, BUN of 95, serum creatinine of 2.65, EGFR of 19, nonfasting blood glucose 89, WBC 11.6, hemoglobin 12.8, platelets of 321.  ED treatment: LR 1 L bolus, Reglan 10 mg IV one-time dose, ondansetron 4 mg IV, Protonix 40 mg IV.

## 2023-08-28 NOTE — Assessment & Plan Note (Addendum)
Resolved, due to AKI. --Monitor BMP

## 2023-08-28 NOTE — Assessment & Plan Note (Addendum)
Improving with IV fluids. Cr 2.65 >> 2.56 >> 1.60 >> 1.41 >> 1.26. Presumed prerenal secondary to poor p.o. intake in setting of intractable nausea and vomiting --Treated with IV fluids --Pt counseled to continue aggressive PO hydration at home and follow up for repeat labs with PCP within 1 week --Monitor BMP --Renally dose meds & avoid nephrotoxins

## 2023-08-28 NOTE — Assessment & Plan Note (Addendum)
-   Continue home statin °

## 2023-08-28 NOTE — ED Provider Notes (Signed)
[facility] Provider Note    Event Date/Time   First MD Initiated Contact with Patient 08/28/23 1245     (approximate)   History   Emesis (Pt. To ED for nausea/vomiting x2 weeks. Pt states she has been to ED for same. BP en route was 80/51, pt. Refused IV. Pt. Denies any urinary or bowel symptoms. Pt. Denies abdominal pain.)   HPI Briana Wright is a 67 y.o. female with HTN, CVA with persistent right-sided weakness presenting today for nausea and vomiting.  Patient states nausea and vomiting for the past 2 weeks.  She was seen in the emergency department approximately 1 week ago for similar symptoms.  Laboratory workup at that time was reassuring with negative CT abdomen/pelvis.  Mild leukocytosis and AKI but otherwise reassuring.  Patient notes feeling fine for a couple days after leaving the emergency department and then had recurrence of nausea and vomiting.  She is still denying abdominal pain, fever, chills, chest pain, shortness of breath, diarrhea, constipation, dysuria.     Physical Exam   Triage Vital Signs: ED Triage Vitals  Encounter Vitals Group     BP      Systolic BP Percentile      Diastolic BP Percentile      Pulse      Resp      Temp      Temp src      SpO2      Weight      Height      Head Circumference      Peak Flow      Pain Score      Pain Loc      Pain Education      Exclude from Growth Chart     Most recent vital signs: Vitals:   08/28/23 2321 08/29/23 0040  BP: (!) 91/59 110/71  Pulse: 64 (!) 57  Resp: 20   Temp: 98.1 F (36.7 C)   SpO2: 99%     Physical Exam: I have reviewed the vital signs and nursing notes. General: Awake, alert, no acute distress.  Nontoxic appearing. Head:  Atraumatic, normocephalic.   ENT:  EOM intact, PERRL. Oral mucosa is pink and moist with no lesions. Neck: Neck is supple with full range of motion, No meningeal signs. Cardiovascular:  RRR, No murmurs. Peripheral pulses palpable and  equal bilaterally. Respiratory:  Symmetrical chest wall expansion.  No rhonchi, rales, or wheezes.  Good air movement throughout.  No use of accessory muscles.   Musculoskeletal:  No cyanosis or edema. Moving extremities with full ROM Abdomen:  Soft, nontender, nondistended. Neuro:  GCS 15, moving all four extremities, interacting appropriately. Speech clear. Psych:  Calm, appropriate.   Skin:  Warm, dry, no rash.    ED Results / Procedures / Treatments   Labs (all labs ordered are listed, but only abnormal results are displayed) Labs Reviewed  CBC WITH DIFFERENTIAL/PLATELET - Abnormal; Notable for the following components:      Result Value   WBC 11.6 (*)    Neutro Abs 8.5 (*)    All other components within normal limits  COMPREHENSIVE METABOLIC PANEL - Abnormal; Notable for the following components:   Potassium 5.3 (*)    Chloride 97 (*)    BUN 95 (*)    Creatinine, Ser 2.65 (*)    Total Protein 8.8 (*)    GFR, Estimated 19 (*)    Anion gap 17 (*)    All other components  within normal limits  LIPASE, BLOOD - Abnormal; Notable for the following components:   Lipase 102 (*)    All other components within normal limits  BASIC METABOLIC PANEL - Abnormal; Notable for the following components:   Sodium 134 (*)    CO2 19 (*)    BUN 94 (*)    Creatinine, Ser 2.56 (*)    GFR, Estimated 20 (*)    All other components within normal limits  CBC - Abnormal; Notable for the following components:   Hemoglobin 11.2 (*)    HCT 34.3 (*)    All other components within normal limits  SARS CORONAVIRUS 2 BY RT PCR  URINALYSIS, ROUTINE W REFLEX MICROSCOPIC     EKG    RADIOLOGY    PROCEDURES:  Critical Care performed: No  Procedures   MEDICATIONS ORDERED IN ED: Medications  acetaminophen (TYLENOL) tablet 650 mg (has no administration in time range)    Or  acetaminophen (TYLENOL) suppository 650 mg (has no administration in time range)  ondansetron (ZOFRAN) tablet 4 mg  (has no administration in time range)    Or  ondansetron (ZOFRAN) injection 4 mg (has no administration in time range)  heparin injection 5,000 Units (5,000 Units Subcutaneous Given 08/29/23 0554)  hydrALAZINE (APRESOLINE) injection 5 mg (has no administration in time range)  0.9 %  sodium chloride infusion ( Intravenous New Bag/Given 08/28/23 2138)  atorvastatin (LIPITOR) tablet 20 mg (20 mg Oral Given 08/28/23 2129)  aspirin chewable tablet 81 mg (has no administration in time range)  pantoprazole (PROTONIX) EC tablet 40 mg (has no administration in time range)  senna-docusate (Senokot-S) tablet 1 tablet (has no administration in time range)  multivitamin with minerals tablet 1 tablet (has no administration in time range)  lactated ringers bolus 1,000 mL (0 mLs Intravenous Stopped 08/28/23 1802)  ondansetron (ZOFRAN) injection 4 mg (4 mg Intravenous Given 08/28/23 1339)  metoCLOPramide (REGLAN) injection 10 mg (10 mg Intravenous Given 08/28/23 1606)  pantoprazole (PROTONIX) injection 40 mg (40 mg Intravenous Given 08/28/23 1606)     IMPRESSION / MDM / ASSESSMENT AND PLAN / ED COURSE  I reviewed the triage vital signs and the nursing notes.                              Differential diagnosis includes, but is not limited to, viral gastroenteritis, enteritis, pancreatitis, viral upper respiratory infection.  Patient's presentation is most consistent with acute presentation with potential threat to life or bodily function.  Patient is a 67 year old female presenting today for recurrent nausea and vomiting with recent ED visit within the past week for similar symptoms.  Hypotensive initially on arrival but otherwise vital signs stable.  Given 1 L fluids and antinausea medication with improvement in symptoms.  Given ongoing vomiting, and recent AKI, concern for ongoing worsening dehydration at this time.  Delay in getting labs as requiring phlebotomy to come to patient's bedside.  Vital signs improved with  fluids.  Patient signed out to oncoming provider while awaiting results of laboratory workup and likely admission for failing outpatient management.  The patient is on the cardiac monitor to evaluate for evidence of arrhythmia and/or significant heart rate changes.     FINAL CLINICAL IMPRESSION(S) / ED DIAGNOSES   Final diagnoses:  Intractable vomiting  AKI (acute kidney injury) (HCC)     Rx / DC Orders   ED Discharge Orders     None  Note:  This document was prepared using Dragon voice recognition software and may include unintentional dictation errors.   Janith Lima, MD 08/29/23 [id number]

## 2023-08-28 NOTE — Assessment & Plan Note (Addendum)
Improving, now intermittent. Gastritis, duodenitis seen on EGD. Duration of 2-3 weeks -- unlikely viral or foodborne illness, negative for Covid.  Lipase mildly elevated but normal appearance of pancreas on CT scan. 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 Currently on IV PPI BID. --Supportive care --Follow pending H pylori stool Ag --Follow EGD biopsy results

## 2023-08-28 NOTE — ED Triage Notes (Signed)
Pt. To ED for nausea/vomiting x2 weeks. Pt states she has been to ED for same. BP en route was 80/51, pt. Refused IV. Pt. Denies any urinary or bowel symptoms. Pt. Denies abdominal pain.

## 2023-08-28 NOTE — Assessment & Plan Note (Addendum)
Meds held on admission due to soft BP's Not tolerating PO medications. 9/7 - N/V little better, PO meds resumed --Continue home BP meds - Coreg, ARB/hydrochlorothiazide and clonidine --IV hydralazine or IV labetalol PRN

## 2023-08-28 NOTE — H&P (Signed)
History and Physical   Briana Wright ZOX:[id number] DOB: 1956/11/01 DOA: 08/28/2023  PCP: Corky Downs, MD  Patient coming from: via EMS  I have personally briefly reviewed patient's old medical records in [facility] EMR.  Chief Concern: intractable nausea and vomiting  HPI: [name] is a 67 year old female with history of hypertension, hyperlipidemia, GERD, history of CVA, who presents to the emergency department for chief concerns of nausea, vomiting for 2 weeks.  Vitals in the ED  showed respiration rate of 15, heart rate of 66, blood pressure 89/67, SpO2 of 100% on room air.  Temperature was 98.5  Serum sodium is 136, potassium 5.3, chloride 97, bicarb 22, BUN of 95, serum creatinine of 2.65, EGFR of 19, nonfasting blood glucose 89, WBC 11.6, hemoglobin 12.8, platelets of 321.  ED treatment: LR 1 L bolus, Reglan 10 mg IV one-time dose, ondansetron 4 mg IV, Protonix 40 mg IV. --------------------------- At bedside, patient was able to tell me her name, age, location. She was not able to tell me the current year.  She reports she has been vomiting for several days. She reports today, she vomiting 5-6 times. She denies blood or coffee ground emesis. She reports she is vomiting up anything she tries to take by mouth.  She denies chest pain,shortness of breath, dysuria, hematuria.   She denies syncope. She denies knowns sick contacts.   Social history: She is a former tobacco user, quitting after her stroke. She denies etoh and recreational drug use.  ROS: Constitutional: no weight change, no fever ENT/Mouth: no sore throat, no rhinorrhea Eyes: no eye pain, no vision changes Cardiovascular: no chest pain, no dyspnea,  no edema, no palpitations Respiratory: no cough, no sputum, no wheezing Gastrointestinal: + nausea, + vomiting, no diarrhea, no constipation Genitourinary: no urinary incontinence, no dysuria, no hematuria Musculoskeletal: no arthralgias, no  myalgias Skin: no skin lesions, no pruritus, Neuro: + weakness, no loss of consciousness, no syncope Psych: no anxiety, no depression, + decrease appetite Heme/Lymph: no bruising, no bleeding  ED Course: Discussed with emergency medicine provider, patient requiring hospitalization for chief concerns of intractable nausea and vomiting.  Assessment/Plan  Principal Problem:   Intractable nausea and vomiting Active Problems:   Hyperkalemia   AKI (acute kidney injury) (HCC)   History of left PCA stroke   Hyperlipemia   Leukocytosis   Weakness   Hypertension   Assessment and Plan:  * Intractable nausea and vomiting Etiology workup in progress, query viral infection, gastroenteritis Check COVID PCR on admission Symptomatic support Sodium chloride infusion at 125 mL/h, 1 day ordered  AKI (acute kidney injury) (HCC) Presumed prerenal secondary to poor p.o. intake in setting of intractable nausea and vomiting Sodium chloride infusion at 125 mL/h, 1 day ordered Symptomatic support: Ondansetron 4 mg IV/p.o. every 6 hours as needed for nausea, vomiting, 5 days ordered Recheck BMP in the a.m.  Hyperkalemia Presumed secondary to acute kidney injury  History of left PCA stroke Home atorvastatin 20 mg nightly, aspirin 81 mg daily resumed  Hypertension Home antihypertensive medications not resumed on admission due to low normotensive blood pressure in the emergency department Hydralazine 5 mg IV every 8 hours as needed for SBP greater 175 AM team to resume home antihypertensive medications when the benefits outweigh the risk  Weakness Fall precautions  Leukocytosis UA ordered on admission pending collection at the time of this dictation Query reactive in setting of acute kidney injury, dehydration reCheck CBC in the a.m.  Hyperlipemia Atorvastatin 20  mg at bedtime ordered  Chart reviewed.   DVT prophylaxis: Heparin 5000 units Code Status: Full code Diet: Renal diet Family  Communication: no. She reports her family knows she is in hospital Disposition Plan: Pending clinical course Consults called: None at this time Admission status: Telemetry medical, inpatient  Past Medical History:  Diagnosis Date   Aneurysm of aortic arch (HCC)    Aneurysm of left carotid artery (HCC) 2024   Hypertension    Stroke [facility])    Past Surgical History:  Procedure Laterality Date   ABDOMINAL HYSTERECTOMY     Social History:  reports that she has been smoking. She does not have any smokeless tobacco history on file. She reports that she does not drink alcohol and does not use drugs.  No Known Allergies Family History  Family history unknown: Yes   Family history: Family history reviewed and not pertinent.  Prior to Admission medications   Medication Sig Start Date End Date Taking? Authorizing Provider  acetaminophen (TYLENOL) 325 MG tablet Take 2 tablets (650 mg total) by mouth every 6 (six) hours as needed for mild pain, fever or headache (or Fever >/= 101). 05/13/23   Sunnie Nielsen, DO  aspirin 81 MG chewable tablet Chew 81 mg by mouth daily. 11/02/15   [provider]  atorvastatin (LIPITOR) 10 MG tablet Take 2 tablets (20 mg total) by mouth at bedtime. 06/01/23   Loyce Dys, MD  carvedilol (COREG) 6.25 MG tablet Take 6.25 mg by mouth 2 (two) times daily with a meal. 06/19/21   [provider]  cloNIDine (CATAPRES) 0.1 MG tablet Take 0.1 mg by mouth every 12 (twelve) hours. 04/30/23 04/29/24  [provider]  Multiple Vitamin (MULTIVITAMIN WITH MINERALS) TABS tablet Take 1 tablet by mouth daily. 06/02/23   Loyce Dys, MD  ondansetron (ZOFRAN) 4 MG tablet Take 1 tablet (4 mg total) by mouth every 6 (six) hours as needed for nausea. 06/01/23   Loyce Dys, MD  pantoprazole (PROTONIX) 40 MG tablet Take 1 tablet (40 mg total) by mouth daily. 05/29/23 05/28/24  Minna Antis, MD  senna-docusate (SENOKOT-S) 8.6-50 MG tablet Take 1 tablet by mouth  at bedtime as needed for mild constipation. 05/13/23   Sunnie Nielsen, DO  sucralfate (CARAFATE) 1 g tablet Take 1 tablet (1 g total) by mouth 4 (four) times daily for 15 days. 05/29/23 06/13/23  Minna Antis, MD   Physical Exam: Vitals:   08/28/23 1430 08/28/23 1530 08/28/23 1838 08/28/23 1952  BP: (!) 109/51 112/68 122/89   Pulse: (!) 56 62 65   Resp: 15 14 16    Temp:    98.5 F (36.9 C)  TempSrc:    Oral  SpO2: 100% 99% 100%   Weight:      Height:       Constitutional: appears age appropriate, appears weak Eyes: PERRL, lids and conjunctivae normal ENMT: Mucous membranes are dry. Posterior pharynx clear of any exudate or lesions. Age-appropriate dentition. Hearing appropriate Neck: normal, supple, no masses, no thyromegaly Respiratory: clear to auscultation bilaterally, no wheezing, no crackles. Normal respiratory effort. No accessory muscle use.  Cardiovascular: Regular rate and rhythm, no murmurs / rubs / gallops. No extremity edema. 2+ pedal pulses. No carotid bruits.  Abdomen: obese abdomen, no tenderness, no masses palpated, no hepatosplenomegaly. Bowel sounds positive.  Musculoskeletal: no clubbing / cyanosis. No joint deformity upper and lower extremities. Good ROM, no contractures, no atrophy. Normal muscle tone.  Skin: no rashes, lesions, ulcers. No  induration Neurologic: Sensation intact. Strength 4/5 in all 4 consistent with generalized weakness Psychiatric: Normal judgment and insight. Alert and oriented x 3. Depressed mood. Flat affect  EKG: Ordered pending completion  Chest x-ray on Admission: I personally reviewed and I agree with radiologist reading as below.  DG Chest Port 1 View  Result Date: 08/28/2023 CLINICAL DATA:  Intractable nausea, vomiting, weakness EXAM: PORTABLE CHEST 1 VIEW COMPARISON:  05/09/2023 FINDINGS: Heart and mediastinal contours within normal limits. Aortic atherosclerosis. Linear scarring in the lung bases. No acute confluent airspace  opacities or effusions. No acute bony abnormality. IMPRESSION: Bibasilar scarring. No active disease. Electronically Signed   By: Charlett Nose M.D.   On: 08/28/2023 20:14    Labs on Admission: I have personally reviewed following labs  CBC: Recent Labs  Lab 08/28/23 1505  WBC 11.6*  NEUTROABS 8.5*  HGB 12.8  HCT 40.5  MCV 84.2  PLT 321   Basic Metabolic Panel: Recent Labs  Lab 08/28/23 1551  NA 136  K 5.3*  CL 97*  CO2 22  GLUCOSE 89  BUN 95*  CREATININE 2.65*  CALCIUM 9.7   GFR: Estimated Creatinine Clearance: 18.3 mL/min (A) (by C-G formula based on SCr of 2.65 mg/dL (H)).  Liver Function Tests: Recent Labs  Lab 08/28/23 1551  AST 21  ALT 11  ALKPHOS 95  BILITOT 0.9  PROT 8.8*  ALBUMIN 4.4   Recent Labs  Lab 08/28/23 1551  LIPASE 102*   Urine analysis:    Component Value Date/Time   COLORURINE YELLOW (A) 05/10/2023 0245   APPEARANCEUR HAZY (A) 05/10/2023 0245   LABSPEC 1.015 05/10/2023 0245   PHURINE 5.0 05/10/2023 0245   GLUCOSEU 50 (A) 05/10/2023 0245   HGBUR NEGATIVE 05/10/2023 0245   BILIRUBINUR NEGATIVE 05/10/2023 0245   KETONESUR NEGATIVE 05/10/2023 0245   PROTEINUR NEGATIVE 05/10/2023 0245   NITRITE NEGATIVE 05/10/2023 0245   LEUKOCYTESUR NEGATIVE 05/10/2023 0245   This document was prepared using Dragon Voice Recognition software and may include unintentional dictation errors.  Dr. Sedalia Muta [facility]  If 7PM-7AM, please contact overnight-coverage provider If 7AM-7PM, please contact day attending provider [email or web address]  08/28/2023, 9:50 PM

## 2023-08-28 NOTE — Assessment & Plan Note (Addendum)
WBC 11.6 on admission - resolved. Likely due to GI illness.  UA negative.  CXR negative.  No other s/sx's of other infection --Follow CBC

## 2023-08-29 DIAGNOSIS — R112 Nausea with vomiting, unspecified: Secondary | ICD-10-CM | POA: Diagnosis not present

## 2023-08-29 LAB — BASIC METABOLIC PANEL
Anion gap: 15 (ref 5–15)
BUN: 94 mg/dL — ABNORMAL HIGH (ref 8–23)
CO2: 19 mmol/L — ABNORMAL LOW (ref 22–32)
Calcium: 8.9 mg/dL (ref 8.9–10.3)
Chloride: 100 mmol/L (ref 98–111)
Creatinine, Ser: 2.56 mg/dL — ABNORMAL HIGH (ref 0.44–1.00)
GFR, Estimated: 20 mL/min — ABNORMAL LOW (ref >60)
Glucose, Bld: 87 mg/dL (ref 70–99)
Potassium: 4.1 mmol/L (ref 3.5–5.1)
Sodium: 134 mmol/L — ABNORMAL LOW (ref 135–145)

## 2023-08-29 LAB — URINALYSIS, ROUTINE W REFLEX MICROSCOPIC
Bilirubin Urine: NEGATIVE
Glucose, UA: 150 mg/dL — AB
Hgb urine dipstick: NEGATIVE
Ketones, ur: 20 mg/dL — AB
Leukocytes,Ua: NEGATIVE
Nitrite: NEGATIVE
Protein, ur: NEGATIVE mg/dL
Specific Gravity, Urine: 1.015 (ref 1.005–1.030)
pH: 5 (ref 5.0–8.0)

## 2023-08-29 LAB — CBC
HCT: 34.3 % — ABNORMAL LOW (ref 36.0–46.0)
Hemoglobin: 11.2 g/dL — ABNORMAL LOW (ref 12.0–15.0)
MCH: 26.9 pg (ref 26.0–34.0)
MCHC: 32.7 g/dL (ref 30.0–36.0)
MCV: 82.5 fL (ref 80.0–100.0)
Platelets: 307 10*3/uL (ref 150–400)
RBC: 4.16 MIL/uL (ref 3.87–5.11)
RDW: 15.1 % (ref 11.5–15.5)
WBC: 9.3 10*3/uL (ref 4.0–10.5)
nRBC: 0 % (ref 0.0–0.2)

## 2023-08-29 MED ORDER — ENSURE ENLIVE PO LIQD: 237.0000 mL | Freq: Three times a day (TID) | ORAL | Status: DC

## 2023-08-29 MED ORDER — BOOST / RESOURCE BREEZE PO LIQD CUSTOM
1.0000 | Freq: Three times a day (TID) | ORAL | Status: DC
  Administered 2023-08-29 – 2023-09-02 (×2): 1 via ORAL

## 2023-08-29 MED ORDER — PROSOURCE PLUS PO LIQD
30.0000 mL | Freq: Three times a day (TID) | ORAL | Status: DC
  Filled 2023-08-29: qty 30

## 2023-08-29 NOTE — Plan of Care (Signed)

## 2023-08-29 NOTE — Progress Notes (Signed)
Initial Nutrition Assessment  DOCUMENTATION CODES:   Not applicable  INTERVENTION:   -Liberalize diet to regular for widest variety of meal selections -MVI with minerals daily -Boost Breeze po TID, each supplement provides 250 kcal and 9 grams of protein  -Magic cup TID with meals, each supplement provides 290 kcal and 9 grams of protein   NUTRITION DIAGNOSIS:   Inadequate oral intake related to altered GI function, poor appetite as evidenced by per patient/family report.  GOAL:   Patient will meet greater than or equal to 90% of their needs  MONITOR:   PO intake, Supplement acceptance  REASON FOR ASSESSMENT:   Malnutrition Screening Tool    ASSESSMENT:   Pt with history of hypertension, hyperlipidemia, GERD, history of CVA, who presents for chief concerns of nausea, vomiting for 2 weeks.  Pt admitted with intractable nausea and vomiting and acute AKI.   Reviewed I/O's: +572 ml x 24 hours  Spoke with pt at bedside, who reports feeling better today. Pt reports that she has been unable to keep foods and liquids down over the past 2 weeks PTA, stating everything that she tried to eat "came right back up". Pt has had no vomiting today and is happy that he has been able to keep food down. Pt explains she is not currently hungry as daughter brought in food [employer], eggs, bacon, and toast from [employer]- pt consumed about 40% of meal).   Pt shares that she has had multiple set backs since May, when she had her stroke. She was hospitalized and sent to rehab, but seems to continue to be admitted to this hospital and feels discouraged by this. Prior to 2 weeks ago, pt reports her appetite started to come back but then nausea and vomiting started. Pt has cases of Ensure at home, but does not like them.  Pt reports her UBW is around 150#; she last weighed this prior to her stroke in MAY. Pt estimates she has lost 20# over this time period. Reviewed wt hx; pt has experienced a  17% wt loss over the past 4 months, which is significant for time frame.   Discussed importance of good meal and supplement intake to promote healing. Reviewed supplements on formulary. Pt amenable to try Boost Breeze.   Medications reviewed and include heparin.   Lab Results  Component Value Date   HGBA1C 5.6 10/07/2015   PTA DM medications are none.   Labs reviewed: Na: 134, Mg: 1.6, CBGS: 78 (inpatient orders for glycemic control are none).    NUTRITION - FOCUSED PHYSICAL EXAM:  Flowsheet Row Most Recent Value  Orbital Region No depletion  Upper Arm Region No depletion  Thoracic and Lumbar Region No depletion  Buccal Region No depletion  Temple Region No depletion  Clavicle Bone Region No depletion  Clavicle and Acromion Bone Region No depletion  Scapular Bone Region No depletion  Dorsal Hand No depletion  Patellar Region No depletion  Anterior Thigh Region No depletion  Posterior Calf Region No depletion  Edema (RD Assessment) None  Hair Reviewed  Eyes Reviewed  Mouth Reviewed  Skin Reviewed  Nails Reviewed       Diet Order:   Diet Order             Diet regular Fluid consistency: Thin  Diet effective now                   EDUCATION NEEDS:   Education needs have been addressed  Skin:  Skin Assessment: Reviewed RN Assessment  Last BM:  08/27/23  Height:   Ht Readings from Last 1 Encounters:  08/28/23 5\' 5"  (1.651 m)    Weight:   Wt Readings from Last 1 Encounters:  08/28/23 60.2 kg    Ideal Body Weight:  56.8 kg  BMI:  Body mass index is 22.09 kg/m.  Estimated Nutritional Needs:   Kcal:  1800-2000  Protein:  90-105 grams  Fluid:  > 1.8 L    Levada Schilling, RD, LDN, CDCES Registered Dietitian II Certified Diabetes Care and Education Specialist Please refer to [facility] for RD and/or RD on-call/weekend/after hours pager

## 2023-08-29 NOTE — Progress Notes (Signed)
Progress Note   [name] NFA:[id number] DOB: 1956/03/27 DOA: 08/28/2023     1 DOS: the patient was seen and examined on 08/29/2023   Brief hospital course: HPI on admissio 08/28/2023:  "[name] is a 67 year old female with history of hypertension, hyperlipidemia, GERD, history of CVA, who presents to the emergency department for chief concerns of nausea, vomiting for 2 weeks."  In the ED , vitals stable except initial BP 89/67. Afebrile.  Labs were notable for K 5.3, Cr 2.65 (up from 1.45 previously), BUN 95, anion gap 17, lipase 102, WBC 11.6k (stable from prior).  Covid-19 Negative.  UA negative for infection.   CT abd/pelvis was unremarkable including normal pancreas.  Patient was admitted to hospital and started on IV fluids, IV antiemetics and supportive care.  Blood pressures responded well to IV fluids and stabilized.  Further hospital course and management as outlined below.  Assessment and Plan: * Intractable nausea and vomiting Etiology workup in progress, query viral infection, gastroenteritis Check COVID PCR on admission Symptomatic support Sodium chloride infusion at 125 mL/h, 1 day ordered  AKI (acute kidney injury) (HCC) Presumed prerenal secondary to poor p.o. intake in setting of intractable nausea and vomiting Sodium chloride infusion at 125 mL/h, 1 day ordered Symptomatic support: Ondansetron 4 mg IV/p.o. every 6 hours as needed for nausea, vomiting, 5 days ordered Recheck BMP in the a.m.  Hyperkalemia Presumed secondary to acute kidney injury  History of left PCA stroke Home atorvastatin 20 mg nightly, aspirin 81 mg daily resumed  Hypertension Home antihypertensive medications not resumed on admission due to low normotensive blood pressure in the emergency department Hydralazine 5 mg IV every 8 hours as needed for SBP greater 175 AM team to resume home antihypertensive medications when the benefits outweigh the risk  Weakness Fall  precautions  Leukocytosis UA ordered on admission pending collection at the time of this dictation Query reactive in setting of acute kidney injury, dehydration reCheck CBC in the a.m.  Hyperlipemia Atorvastatin 20 mg at bedtime ordered        Subjective: Pt awake resting in bed this AM.  She reports ongoing nausea, not vomiting this AM.  Denies abdominal pain.  States she initially had diarrhea when her illness started but it resolved.  Since then, it's been persistent N/V and inability to keep anything down at home. No other acute complaints.  No sick contacts or dietary or medicine changes.  Physical Exam: Vitals:   08/28/23 1952 08/28/23 2321 08/29/23 0040 08/29/23 0805  BP:  (!) 91/59 110/71 (!) 167/86  Pulse:  64 (!) 57 (!) 59  Resp:  20  16  Temp: 98.5 F (36.9 C) 98.1 F (36.7 C)  (!) 97.5 F (36.4 C)  TempSrc: Oral     SpO2:  99%  100%  Weight:      Height:       General exam: awake, alert, no acute distress, mildly ill appearing HEENT: clear conjunctiva, anicteric sclera, moist mucus membranes, hearing grossly normal  Respiratory system: CTAB no wheezes, rales or rhonchi, normal respiratory effort. Cardiovascular system: normal S1/S2, RRR, no JVD, murmurs, rubs, gallops, no pedal edema.   Gastrointestinal system: soft, NT, ND, no HSM felt, +bowel sounds. Central nervous system: A&O x 3. no gross focal neurologic deficits, normal speech Extremities: moves all, no edema, normal tone Skin: dry, intact, normal temperature Psychiatry: normal mood, congruent affect, judgement and insight appear normal   Data Reviewed:  Notable labs -- K normalized  4.1 Na 134 BUN 94 Cr 2.56 from 2.65 Hbg 11.2 stable / diluted  Family Communication: None present. Pt able to update.  Disposition: Status is: Inpatient Remains inpatient appropriate because: remains on IV fluids and IV meds as above until further improvement   Planned Discharge Destination: Home    Time  spent: 45 minutes  Author: Pennie Banter, DO 08/29/2023 2:18 PM  For on call review [email or web address].

## 2023-08-29 NOTE — TOC Progression Note (Signed)
Transition of Care [facility]) - Progression Note    Patient Details  Name: Briana Wright MRN: [id number] Date of Birth: 02-Jul-1956  [facility] of [facility]) CM/SW Contact  Marlowe Sax, RN Phone Number: 08/29/2023, 4:23 PM  Clinical Narrative:     From home with husband Still on IV ABX and fluids, TOC to continue to follow for needs  Expected Discharge Plan: Home/Self Care Barriers to Discharge: No Barriers Identified  Expected Discharge Plan and Services   Discharge Planning Services: CM Consult   Living arrangements for the past 2 months: Single Family Home                                       Social Determinants of Health (SDOH) Interventions SDOH Screenings   Food Insecurity: Unknown (07/12/2023)   Received from [facility]  Housing: Low Risk  (05/29/2023)  Transportation Needs: Unmet Transportation Needs (07/12/2023)   Received from [facility]  Utilities: Not At Risk (07/12/2023)   Received from [facility]  Financial Resource Strain: Low Risk  (07/12/2023)   Received from [facility]  Tobacco Use: High Risk (08/28/2023)    Readmission Risk Interventions    06/01/2023   12:05 PM  Readmission Risk Prevention Plan  Post Dischage Appt Complete  Medication Screening Complete  Transportation Screening Complete

## 2023-08-30 DIAGNOSIS — R112 Nausea with vomiting, unspecified: Secondary | ICD-10-CM | POA: Diagnosis not present

## 2023-08-30 LAB — BASIC METABOLIC PANEL
Anion gap: 14 (ref 5–15)
BUN: 51 mg/dL — ABNORMAL HIGH (ref 8–23)
CO2: 20 mmol/L — ABNORMAL LOW (ref 22–32)
Calcium: 9 mg/dL (ref 8.9–10.3)
Chloride: 104 mmol/L (ref 98–111)
Creatinine, Ser: 1.6 mg/dL — ABNORMAL HIGH (ref 0.44–1.00)
GFR, Estimated: 35 mL/min — ABNORMAL LOW (ref >60)
Glucose, Bld: 79 mg/dL (ref 70–99)
Potassium: 3.5 mmol/L (ref 3.5–5.1)
Sodium: 138 mmol/L (ref 135–145)

## 2023-08-30 LAB — LIPASE, BLOOD: Lipase: 116 U/L — ABNORMAL HIGH (ref 11–51)

## 2023-08-30 LAB — MAGNESIUM: Magnesium: 1.9 mg/dL (ref 1.7–2.4)

## 2023-08-30 MED ORDER — SODIUM CHLORIDE 0.9 % IV SOLN: INTRAVENOUS | Status: DC

## 2023-08-30 MED ORDER — HYDRALAZINE HCL 20 MG/ML IJ SOLN
10.0000 mg | Freq: Four times a day (QID) | INTRAMUSCULAR | Status: DC | PRN
  Administered 2023-08-30 – 2023-08-31 (×3): 10 mg via INTRAVENOUS
  Filled 2023-08-30 (×4): qty 1

## 2023-08-30 MED ORDER — SUCRALFATE 1 GM/10ML PO SUSP
1.0000 g | Freq: Three times a day (TID) | ORAL | Status: DC
  Administered 2023-08-31 – 2023-09-02 (×7): 1 g via ORAL
  Filled 2023-08-30 (×10): qty 10

## 2023-08-30 MED ORDER — POTASSIUM CHLORIDE 10 MEQ/100ML IV SOLN
10.0000 meq | INTRAVENOUS | Status: AC
  Administered 2023-08-30 (×3): 10 meq via INTRAVENOUS
  Filled 2023-08-30 (×3): qty 100

## 2023-08-30 MED ORDER — PROCHLORPERAZINE EDISYLATE 10 MG/2ML IJ SOLN
10.0000 mg | Freq: Four times a day (QID) | INTRAMUSCULAR | Status: DC | PRN
  Administered 2023-08-30 (×2): 10 mg via INTRAVENOUS
  Filled 2023-08-30 (×4): qty 2

## 2023-08-30 MED ORDER — PANTOPRAZOLE SODIUM 40 MG IV SOLR
40.0000 mg | Freq: Two times a day (BID) | INTRAVENOUS | Status: DC
  Administered 2023-08-30 – 2023-09-02 (×7): 40 mg via INTRAVENOUS
  Filled 2023-08-30 (×8): qty 10

## 2023-08-30 NOTE — Plan of Care (Signed)

## 2023-08-30 NOTE — Plan of Care (Signed)

## 2023-08-30 NOTE — TOC Progression Note (Signed)
Transition of Care [facility]) - Progression Note    Patient Details  Name: Briana Wright MRN: [id number] Date of Birth: 1956/01/23  Transition [facility] Care [facility]) CM/SW Contact  Marlowe Sax, RN Phone Number: 08/30/2023, 12:45 PM  Clinical Narrative:     TOC continues to follow and will assist with DC needs  Expected Discharge Plan: Home/Self Care Barriers to Discharge: No Barriers Identified  Expected Discharge Plan and Services   Discharge Planning Services: CM Consult   Living arrangements for the past 2 months: Single Family Home                                       Social Determinants of Health (SDOH) Interventions SDOH Screenings   Food Insecurity: Unknown (07/12/2023)   Received from [facility]  Housing: Low Risk  (05/29/2023)  Transportation Needs: Unmet Transportation Needs (07/12/2023)   Received from [facility]  Utilities: Not At Risk (07/12/2023)   Received from [facility]  Financial Resource Strain: Low Risk  (07/12/2023)   Received from [facility]  Tobacco Use: High Risk (08/28/2023)    Readmission Risk Interventions    06/01/2023   12:05 PM  Readmission Risk Prevention Plan  Post Dischage Appt Complete  Medication Screening Complete  Transportation Screening Complete

## 2023-08-30 NOTE — Care Management Important Message (Signed)
Important Message  Patient Details  Name: Briana Wright MRN: [id number] Date of Birth: March 27, 1956   Medicare Important Message Given:  Yes  Ms. Blonder said she was not feeling well but was up to me reviewing the Important Message from Medicare with her and she stated she understood her rights. She did not feel like signing the form so I left 2 copies in her room and asked her to sign one copy and give to nursing staff to place in her medical record in the event she discharged over the weekend and she said she would. I thanked her for her time and wished her a speedy recovery.   Olegario Messier A Aiana Nordquist 08/30/2023, 2:37 PM

## 2023-08-30 NOTE — Consult Note (Addendum)
Arlyss Repress, MD [address]  [address]  [place], [place] [address]  Main: [phone]  Fax: [phone] Pager: [phone]   Consultation  Referring Provider:     No ref. provider found Primary Care Physician:  Corky Downs, MD Primary Gastroenterologist: Dr. Wyline Mood         Reason for Consultation: Nausea and vomiting  Date of Admission:  08/28/2023 Date of Consultation:  08/30/2023         HPI:   Briana Wright is a 67 y.o. female with history of hypertension, hyperlipidemia, CVA presented with approximately 1 month history of intractable nausea and vomiting, poor p.o. intake.  Patient denies any coffee-ground emesis or hematemesis, denies any abdominal pain.  She is unable to tolerate her meds as well.  Patient denies any constipation or diarrhea.  She denies any alcohol use, marijuana use or tobacco use Patient denies any sick contacts, recent illness, [place] any change in medications.  She denies consuming carbonated beverages, sugary drinks, red meat on a regular basis Therefore, admitted for further evaluation.  She presented with dehydration, worsening of renal function, mild leukocytosis. CT abdomen and pelvis with contrast during this admission was unremarkable  NSAIDs: None  Antiplts/Anticoagulants/Anti thrombotics: None  GI Procedures: None  Past Medical History:  Diagnosis Date   Aneurysm of aortic arch (HCC)    Aneurysm of left carotid artery (HCC) 2024   Hypertension    Stroke [facility])     Past Surgical History:  Procedure Laterality Date   ABDOMINAL HYSTERECTOMY       Current Facility-Administered Medications:    0.9 %  sodium chloride infusion, , Intravenous, Continuous, Pennie Banter, DO, Last Rate: 75 mL/hr at 08/30/23 1649, Restarted at 08/30/23 1649   acetaminophen (TYLENOL) tablet 650 mg, 650 mg, Oral, Q6H PRN **OR** acetaminophen (TYLENOL) suppository 650 mg, 650 mg, Rectal, Q6H PRN, Cox, Amy N, DO   aspirin chewable tablet 81  mg, 81 mg, Oral, Daily, Cox, Amy N, DO, 81 mg at 08/29/23 1008   atorvastatin (LIPITOR) tablet 20 mg, 20 mg, Oral, QHS, Cox, Amy N, DO, 20 mg at 08/29/23 2133   feeding supplement (BOOST / RESOURCE BREEZE) liquid 1 Container, 1 Container, Oral, TID BM, Esaw Grandchild A, DO, 1 Container at 08/29/23 2134   heparin injection 5,000 Units, 5,000 Units, Subcutaneous, Q8H, Cox, Amy N, DO, 5,000 Units at 08/30/23 1547   hydrALAZINE (APRESOLINE) injection 10 mg, 10 mg, Intravenous, Q6H PRN, Esaw Grandchild A, DO, 10 mg at 08/30/23 1107   multivitamin with minerals tablet 1 tablet, 1 tablet, Oral, Daily, Cox, Amy N, DO, 1 tablet at 08/29/23 1008   ondansetron (ZOFRAN) tablet 4 mg, 4 mg, Oral, Q6H PRN **OR** ondansetron (ZOFRAN) injection 4 mg, 4 mg, Intravenous, Q6H PRN, Cox, Amy N, DO, 4 mg at 08/30/23 1646   pantoprazole (PROTONIX) injection 40 mg, 40 mg, Intravenous, Q12H, Esaw Grandchild A, DO, 40 mg at 08/30/23 1107   prochlorperazine (COMPAZINE) injection 10 mg, 10 mg, Intravenous, Q6H PRN, Esaw Grandchild A, DO, 10 mg at 08/30/23 0950   senna-docusate (Senokot-S) tablet 1 tablet, 1 tablet, Oral, QHS PRN, Cox, Amy N, DO   sucralfate (CARAFATE) 1 GM/10ML suspension 1 g, 1 g, Oral, TID WC & HS, Keavon Sensing, Loel Dubonnet, MD   Family History  Family history unknown: Yes     Social History   Tobacco Use   Smoking status: Every Day  Substance Use Topics   Alcohol use: No  Drug use: Never    Allergies as of 08/28/2023   (No Known Allergies)    Review of Systems:    All systems reviewed and negative except where noted in HPI.   Physical Exam:  Vital signs in last 24 hours: Temp:  [98.3 F (36.8 C)-98.5 F (36.9 C)] 98.5 F (36.9 C) (09/06 0752) Pulse Rate:  [69-96] 96 (09/06 0752) Resp:  [16] 16 (09/06 0752) BP: (159-178)/(77-101) 178/99 (09/06 1107) SpO2:  [97 %-100 %] 100 % (09/06 0752) Last BM Date : 08/28/23 General: Lethargic, arousable, able to answer questions, cooperative in  NAD Head:  Normocephalic and atraumatic. Eyes:   No icterus.   Conjunctiva pink. PERRLA. Ears:  Normal auditory acuity. Neck:  Supple; no masses or thyroidomegaly Lungs: Respirations even and unlabored. Lungs clear to auscultation bilaterally.   No wheezes, crackles, or rhonchi.  Heart:  Regular rate and rhythm;  Without murmur, clicks, rubs or gallops Abdomen:  Soft, nondistended, nontender. Normal bowel sounds. No appreciable masses or hepatomegaly.  No rebound or guarding.  Rectal:  Not performed. Msk:  Symmetrical without gross deformities.  Strength generalized weakness Extremities:  Without edema, cyanosis or clubbing. Neurologic:  Alert and oriented x3;  grossly normal neurologically. Skin:  Intact without significant lesions or rashes. Psych:  Alert and cooperative. Normal affect.  LAB RESULTS:    Latest Ref Rng & Units 08/29/2023    4:04 AM 08/28/2023    3:05 PM 08/20/2023   10:52 AM  CBC  WBC 4.0 - 10.5 K/uL 9.3  11.6  11.6   Hemoglobin 12.0 - 15.0 g/dL 67.2  09.4  70.9   Hematocrit 36.0 - 46.0 % 34.3  40.5  39.9   Platelets 150 - 400 K/uL 307  321  395     BMET    Latest Ref Rng & Units 08/30/2023    5:28 AM 08/29/2023    4:04 AM 08/28/2023    3:51 PM  BMP  Glucose 70 - 99 mg/dL 79  87  89   BUN 8 - 23 mg/dL 51  94  95   Creatinine 0.44 - 1.00 mg/dL 6.28  3.66  2.94   Sodium 135 - 145 mmol/L 138  134  136   Potassium 3.5 - 5.1 mmol/L 3.5  4.1  5.3   Chloride 98 - 111 mmol/L 104  100  97   CO2 22 - 32 mmol/L 20  19  22    Calcium 8.9 - 10.3 mg/dL 9.0  8.9  9.7     LFT    Latest Ref Rng & Units 08/28/2023    3:51 PM 08/20/2023   10:52 AM 06/01/2023    5:49 AM  Hepatic Function  Total Protein 6.5 - 8.1 g/dL 8.8  9.7  6.8   Albumin 3.5 - 5.0 g/dL 4.4  5.0  3.0   AST 15 - 41 U/L 21  22  88   ALT 0 - 44 U/L 11  16  93   Alk Phosphatase 38 - 126 U/L 95  129  280   Total Bilirubin 0.3 - 1.2 mg/dL 0.9  1.2  1.1      STUDIES: DG Chest Port 1 View  Result Date:  08/28/2023 CLINICAL DATA:  Intractable nausea, vomiting, weakness EXAM: PORTABLE CHEST 1 VIEW COMPARISON:  05/09/2023 FINDINGS: Heart and mediastinal contours within normal limits. Aortic atherosclerosis. Linear scarring in the lung bases. No acute confluent airspace opacities or effusions. No acute bony abnormality. IMPRESSION: Bibasilar scarring. No  active disease. Electronically Signed   By: Charlett Nose M.D.   On: 08/28/2023 20:14      Impression / Plan:   Briana Wright is a 67 y.o. female with history of hypertension, stroke, hyperlipidemia presented with approximately 1 month history of intractable nausea and vomiting  Intractable nausea and vomiting No evidence of cholelithiasis, cholecystitis or acute pancreatitis CT abdomen and pelvis with contrast did not reveal any acute intra-abdominal pathology Differentials include H. pylori infection or severe acid reflux or esophagitis or gastroparesis or functional dyspepsia suspension 3 times daily as needed Antiemetics, alternating Zofran and Phenergan at scheduled interval of 6 to 8 hours Check H. pylori stool antigen Check a.m. cortisol levels Diet as tolerated, continue adequate hydration Tentative plan for upper endoscopy on Sunday  Thank you for involving me in the care of this patient.      LOS: 2 days   Lannette Donath, MD  08/30/2023, 1:54 PM    Note: This dictation was prepared with Dragon dictation along with smaller phrase technology. Any transcriptional errors that result from this process are unintentional.

## 2023-08-30 NOTE — Progress Notes (Signed)
Progress Note   [name] UJW:[id number] DOB: [birth year] DOA: 08/28/2023     2 DOS: the patient was seen and examined on 08/30/2023   Brief hospital course: HPI on admissio 08/28/2023:  "Briana Wright is a 67 year old female with history of hypertension, hyperlipidemia, GERD, history of CVA, who presents to the emergency department for chief concerns of nausea, vomiting for 2 weeks."  In the ED , vitals stable except initial BP 89/67. Afebrile.  Labs were notable for K 5.3, Cr 2.65 (up from 1.45 previously), BUN 95, anion gap 17, lipase 102, WBC 11.6k (stable from prior).  Covid-19 Negative.  UA negative for infection.   CT abd/pelvis was unremarkable including normal pancreas.  Patient was admitted to hospital and started on IV fluids, IV antiemetics and supportive care.  Blood pressures responded well to IV fluids and stabilized.  Further hospital course and management as outlined below.  Assessment and Plan: * Intractable nausea and vomiting Etiology unclear.  Duration of 2-3 weeks -- unlikely viral or foodborne illness, negative for Covid.  Lipase mildly elevated but normal appearance of pancreas on CT scan. ?H pylori --GI consulted for further evaluation --Continue IV fluids,  --Continue IV anti-emetics --Reduce diet to full liquids for now --Supportive care  AKI (acute kidney injury) (HCC) Improving with IV fluids. Cr 2.65 >> 2.56 >> 1.60. Presumed prerenal secondary to poor p.o. intake in setting of intractable nausea and vomiting --Continue IV fluids --Monitor BMP --Renally dose meds & avoid nephrotoxins  Hyperkalemia Resolved, due to AKI. --Monitor BMP  History of left PCA stroke Continue home statin  Hypertension Meds held on admission due to soft BP's Not tolerating PO medications. --IV hydralazine PRN for now --Resume home regimen when able  Weakness Fall precautions  Leukocytosis WBC 11.6 on admission - resolved. Likely due to GI  illness.  UA negative.  CXR negative.  No other s/sx's of other infection --Follow CBC  Hyperlipemia Continue home statin        Subjective: Pt awake resting in bed this AM, in fetal position with emesis bag next to her.  Pt vomiting this AM, was not able to take PO meds.  Needing additional antiemetic today.  She had just been given Compazine about 20 min before my encounter, reported it was maybe starting to work.  She denies abdominal pain, only N/V.  Physical Exam: Vitals:   08/30/23 0547 08/30/23 0752 08/30/23 1107 08/30/23 1556  BP: (!) 174/97 (!) 177/99 (!) 178/99   Pulse: 78 96  (!) 109  Resp:  16  16  Temp:  98.5 F (36.9 C)  99.2 F (37.3 C)  TempSrc:  Oral  Oral  SpO2:  100%  100%  Weight:      Height:       General exam: awake, alert, no acute distress, ill-appearing HEENT: moist mucus membranes, hearing grossly normal  Respiratory system: CTAB no wheezes, rales or rhonchi, normal respiratory effort. Cardiovascular system: normal S1/S2, RRR, no pedal edema.   Gastrointestinal system: soft, NT, ND Central nervous system: A&O x 3. no gross focal neurologic deficits, normal speech Extremities: moves all, no edema, normal tone Skin: dry, intact, normal temperature Psychiatry: normal mood, congruent affect, judgement and insight appear normal   Data Reviewed:  Notable labs -- Bicarb 20, BUN 51, Cr 2.56 >> 1.60 improving, LIPASE 102 >> 116    Family Communication: Updated patient's niece, Briana Wright by phone today  Disposition: Status is: Inpatient Remains inpatient appropriate because: remains on IV  fluids and IV meds as above until further improvement. Ongoing nausea/vomiting, not tolerating PO intake.   Planned Discharge Destination: Home    Time spent: 42 minutes  Author: Pennie Banter, DO 08/30/2023 4:12 PM  For on call review [email or web address].

## 2023-08-31 ENCOUNTER — Inpatient Hospital Stay: Payer: Medicare Other | Admitting: Anesthesiology

## 2023-08-31 ENCOUNTER — Encounter
Admission: EM | Disposition: A | Discharge status: Home / Self Care | Payer: Self-pay | Source: Home / Self Care | Attending: Internal Medicine

## 2023-08-31 DIAGNOSIS — E8729 Other acidosis: Secondary | ICD-10-CM | POA: Diagnosis not present

## 2023-08-31 DIAGNOSIS — K2990 Gastroduodenitis, unspecified, without bleeding: Secondary | ICD-10-CM

## 2023-08-31 DIAGNOSIS — R112 Nausea with vomiting, unspecified: Secondary | ICD-10-CM | POA: Diagnosis not present

## 2023-08-31 DIAGNOSIS — K3182 Dieulafoy lesion (hemorrhagic) of stomach and duodenum: Secondary | ICD-10-CM

## 2023-08-31 DIAGNOSIS — K2970 Gastritis, unspecified, without bleeding: Secondary | ICD-10-CM

## 2023-08-31 HISTORY — PX: ESOPHAGOGASTRODUODENOSCOPY: SHX5428

## 2023-08-31 HISTORY — PX: BIOPSY: SHX5522

## 2023-08-31 HISTORY — PX: HEMOSTASIS CLIP PLACEMENT: SHX6857

## 2023-08-31 HISTORY — PX: SUBMUCOSAL INJECTION: SHX5543

## 2023-08-31 LAB — BASIC METABOLIC PANEL
Anion gap: 19 — ABNORMAL HIGH (ref 5–15)
BUN: 25 mg/dL — ABNORMAL HIGH (ref 8–23)
CO2: 14 mmol/L — ABNORMAL LOW (ref 22–32)
Calcium: 9.2 mg/dL (ref 8.9–10.3)
Chloride: 107 mmol/L (ref 98–111)
Creatinine, Ser: 1.41 mg/dL — ABNORMAL HIGH (ref 0.44–1.00)
GFR, Estimated: 41 mL/min — ABNORMAL LOW (ref >60)
Glucose, Bld: 93 mg/dL (ref 70–99)
Potassium: 3.7 mmol/L (ref 3.5–5.1)
Sodium: 140 mmol/L (ref 135–145)

## 2023-08-31 LAB — CBC
HCT: 37.3 % (ref 36.0–46.0)
Hemoglobin: 12 g/dL (ref 12.0–15.0)
MCH: 27.2 pg (ref 26.0–34.0)
MCHC: 32.2 g/dL (ref 30.0–36.0)
MCV: 84.6 fL (ref 80.0–100.0)
Platelets: 353 10*3/uL (ref 150–400)
RBC: 4.41 MIL/uL (ref 3.87–5.11)
RDW: 15.6 % — ABNORMAL HIGH (ref 11.5–15.5)
WBC: 11.8 10*3/uL — ABNORMAL HIGH (ref 4.0–10.5)
nRBC: 0 % (ref 0.0–0.2)

## 2023-08-31 LAB — MAGNESIUM: Magnesium: 1.6 mg/dL — ABNORMAL LOW (ref 1.7–2.4)

## 2023-08-31 LAB — LIPASE, BLOOD: Lipase: 86 U/L — ABNORMAL HIGH (ref 11–51)

## 2023-08-31 LAB — CORTISOL: Cortisol, Plasma: 36.5 ug/dL

## 2023-08-31 LAB — LACTIC ACID, PLASMA: Lactic Acid, Venous: 0.7 mmol/L (ref 0.5–1.9)

## 2023-08-31 SURGERY — EGD (ESOPHAGOGASTRODUODENOSCOPY)
Anesthesia: General

## 2023-08-31 MED ORDER — FENTANYL CITRATE (PF) 100 MCG/2ML IJ SOLN
INTRAMUSCULAR | Status: DC | PRN
  Administered 2023-08-31: 50 ug via INTRAVENOUS
  Administered 2023-08-31 (×2): 25 ug via INTRAVENOUS

## 2023-08-31 MED ORDER — ONDANSETRON HCL 4 MG/2ML IJ SOLN
INTRAMUSCULAR | Status: DC | PRN
  Administered 2023-08-31: 4 mg via INTRAVENOUS

## 2023-08-31 MED ORDER — FENTANYL CITRATE (PF) 100 MCG/2ML IJ SOLN
INTRAMUSCULAR | Status: AC
  Filled 2023-08-31: qty 2

## 2023-08-31 MED ORDER — LIDOCAINE HCL (CARDIAC) PF 100 MG/5ML IV SOSY
PREFILLED_SYRINGE | INTRAVENOUS | Status: DC | PRN
  Administered 2023-08-31: 100 mg via INTRAVENOUS

## 2023-08-31 MED ORDER — ONDANSETRON HCL 4 MG/2ML IJ SOLN: 4.0000 mg | Freq: Once | INTRAMUSCULAR | Status: DC | PRN

## 2023-08-31 MED ORDER — LACTATED RINGERS IV SOLN
INTRAVENOUS | Status: DC | PRN
Start: 2023-08-31 — End: 2023-08-31

## 2023-08-31 MED ORDER — SODIUM CHLORIDE 0.9 % IV SOLN: INTRAVENOUS | Status: DC

## 2023-08-31 MED ORDER — CLONIDINE HCL 0.1 MG PO TABS
0.1000 mg | ORAL_TABLET | Freq: Two times a day (BID) | ORAL | Status: DC
  Administered 2023-09-01 – 2023-09-02 (×2): 0.1 mg via ORAL
  Filled 2023-08-31 (×3): qty 1

## 2023-08-31 MED ORDER — LABETALOL HCL 5 MG/ML IV SOLN
5.0000 mg | INTRAVENOUS | Status: DC | PRN
  Administered 2023-08-31: 5 mg via INTRAVENOUS
  Filled 2023-08-31: qty 4

## 2023-08-31 MED ORDER — EPINEPHRINE 1 MG/10ML IJ SOSY
PREFILLED_SYRINGE | INTRAMUSCULAR | Status: DC | PRN
Start: 2023-08-31 — End: 2023-08-31
  Administered 2023-08-31: .4 mg via INTRAVENOUS

## 2023-08-31 MED ORDER — OLMESARTAN MEDOXOMIL-HCTZ 40-25 MG PO TABS: 1.0000 | ORAL_TABLET | Freq: Every day | ORAL | Status: DC

## 2023-08-31 MED ORDER — PHENYLEPHRINE 80 MCG/ML (10ML) SYRINGE FOR IV PUSH (FOR BLOOD PRESSURE SUPPORT)
PREFILLED_SYRINGE | INTRAVENOUS | Status: DC | PRN
Start: 2023-08-31 — End: 2023-08-31
  Administered 2023-08-31 (×2): 160 ug via INTRAVENOUS
  Administered 2023-08-31: 80 ug via INTRAVENOUS
  Administered 2023-08-31: 160 ug via INTRAVENOUS

## 2023-08-31 MED ORDER — SODIUM CHLORIDE 0.45 % IV SOLN
INTRAVENOUS | Status: AC
  Filled 2023-08-31 (×4): qty 75

## 2023-08-31 MED ORDER — PROPOFOL 1000 MG/100ML IV EMUL
INTRAVENOUS | Status: AC
  Filled 2023-08-31: qty 100

## 2023-08-31 MED ORDER — PROPOFOL 500 MG/50ML IV EMUL
INTRAVENOUS | Status: DC | PRN
Start: 2023-08-31 — End: 2023-08-31
  Administered 2023-08-31: 150 ug/kg/min via INTRAVENOUS

## 2023-08-31 MED ORDER — PROPOFOL 10 MG/ML IV BOLUS
INTRAVENOUS | Status: DC | PRN
  Administered 2023-08-31: 120 mg via INTRAVENOUS
  Administered 2023-08-31: 40 mg via INTRAVENOUS

## 2023-08-31 MED ORDER — EPINEPHRINE 1 MG/10ML IJ SOSY
PREFILLED_SYRINGE | INTRAMUSCULAR | Status: AC
  Filled 2023-08-31: qty 10

## 2023-08-31 MED ORDER — IRBESARTAN 150 MG PO TABS
300.0000 mg | ORAL_TABLET | Freq: Every day | ORAL | Status: DC
  Administered 2023-08-31 – 2023-09-02 (×3): 300 mg via ORAL
  Filled 2023-08-31 (×3): qty 2

## 2023-08-31 MED ORDER — PROPOFOL 10 MG/ML IV BOLUS
INTRAVENOUS | Status: AC
  Filled 2023-08-31: qty 20

## 2023-08-31 MED ORDER — CARVEDILOL 3.125 MG PO TABS
6.2500 mg | ORAL_TABLET | Freq: Two times a day (BID) | ORAL | Status: DC
  Administered 2023-08-31 – 2023-09-02 (×4): 6.25 mg via ORAL
  Filled 2023-08-31 (×5): qty 2

## 2023-08-31 MED ORDER — DEXAMETHASONE SODIUM PHOSPHATE 10 MG/ML IJ SOLN
INTRAMUSCULAR | Status: DC | PRN
  Administered 2023-08-31: 10 mg via INTRAVENOUS

## 2023-08-31 MED ORDER — HYDROCHLOROTHIAZIDE 25 MG PO TABS
25.0000 mg | ORAL_TABLET | Freq: Every day | ORAL | Status: DC
  Administered 2023-08-31 – 2023-09-02 (×3): 25 mg via ORAL
  Filled 2023-08-31 (×3): qty 1

## 2023-08-31 MED ORDER — SUCCINYLCHOLINE CHLORIDE 200 MG/10ML IV SOSY
PREFILLED_SYRINGE | INTRAVENOUS | Status: DC | PRN
  Administered 2023-08-31: 120 mg via INTRAVENOUS

## 2023-08-31 NOTE — Assessment & Plan Note (Signed)
9/8 - Mg 1.5 - replacing with 2 g IV  Monitor Mg & replace PRN

## 2023-08-31 NOTE — Transfer of Care (Signed)
Immediate Anesthesia Transfer of Care Note  [name]  Procedure(s) Performed: ESOPHAGOGASTRODUODENOSCOPY (EGD) SUBMUCOSAL INJECTION HEMOSTASIS CLIP PLACEMENT BIOPSY  Patient Location: PACU  Anesthesia Type:General  Level of Consciousness: drowsy  Airway & Oxygen Therapy: Patient Spontanous Breathing and Patient connected to face mask oxygen  Post-op Assessment: Report given to RN and Post -op Vital signs reviewed and stable  Post vital signs: Reviewed and stable  Last Vitals:  Vitals Value Taken Time  BP 169/93   Temp    Pulse 110   Resp 12   SpO2 99     Last Pain:  Vitals:   08/31/23 1100  TempSrc: Oral  PainSc: 0-No pain         Complications: No notable events documented.

## 2023-08-31 NOTE — Anesthesia Procedure Notes (Signed)
Procedure Name: Intubation Date/Time: 08/31/2023 12:36 PM  Performed by: Joanette Gula, Damiean Lukes, CRNAPre-anesthesia Checklist: Patient identified, Emergency Drugs available, Suction available and Patient being monitored Patient Re-evaluated:Patient Re-evaluated prior to induction Oxygen Delivery Method: Circle system utilized Preoxygenation: Pre-oxygenation with 100% oxygen Induction Type: IV induction Ventilation: Mask ventilation without difficulty Laryngoscope Size: McGraph and 3 Grade View: Grade III Tube type: Oral Number of attempts: 1 Airway Equipment and Method: Stylet Placement Confirmation: ETT inserted through vocal cords under direct vision, positive ETCO2 and breath sounds checked- equal and bilateral Secured at: 20 cm Tube secured with: Tape Dental Injury: Teeth and Oropharynx as per pre-operative assessment

## 2023-08-31 NOTE — Anesthesia Postprocedure Evaluation (Signed)
Anesthesia Post Note  [name]  Procedure(s) Performed: ESOPHAGOGASTRODUODENOSCOPY (EGD) SUBMUCOSAL INJECTION HEMOSTASIS CLIP PLACEMENT BIOPSY  Patient location during evaluation: PACU Anesthesia Type: General Level of consciousness: awake and alert Pain management: pain level controlled Vital Signs Assessment: post-procedure vital signs reviewed and stable Respiratory status: spontaneous breathing, nonlabored ventilation, respiratory function stable and patient connected to nasal cannula oxygen Cardiovascular status: blood pressure returned to baseline and stable Postop Assessment: no apparent nausea or vomiting Anesthetic complications: no   No notable events documented.   Last Vitals:  Vitals:   08/31/23 1346 08/31/23 1350  BP: (!) 171/90 (!) 183/122  Pulse: (!) 107 (!) 107  Resp: (!) 21 (!) 23  Temp: 36.9 C   SpO2: 97% 97%    Last Pain:  Vitals:   08/31/23 1346  TempSrc:   PainSc: 0-No pain                 Corinda Gubler

## 2023-08-31 NOTE — Assessment & Plan Note (Signed)
Noted on EGD 9/7 - see Op Note and GI's recommendations.

## 2023-08-31 NOTE — Assessment & Plan Note (Addendum)
9/7 bicarb down 20 >> 14, gap 19. 9/8 bicarb improved to 20 Suspect due to N/V. Normal lactic acid level. --Resolved with Bicarb-1/2NS  --Monitor BMP at follow up

## 2023-08-31 NOTE — Plan of Care (Signed)

## 2023-08-31 NOTE — Anesthesia Preprocedure Evaluation (Signed)
Anesthesia Evaluation  Patient identified by MRN, date of birth, ID band Patient awake  General Assessment Comment:  Patient AO x 3, understands what is being done and why.  Reviewed: Allergy & Precautions, NPO status , Patient's Chart, lab work & pertinent test results  History of Anesthesia Complications Negative for: history of anesthetic complications  Airway Mallampati: III  TM Distance: >3 FB Neck ROM: Full    Dental no notable dental hx. (+) Teeth Intact   Pulmonary neg sleep apnea, neg COPD, Current Smoker and Patient abstained from smoking.   Pulmonary exam normal breath sounds clear to auscultation       Cardiovascular Exercise Tolerance: Poor METShypertension, Pt. on medications (-) CAD and (-) Past MI (-) dysrhythmias  Rhythm:Regular Rate:Normal - Systolic murmurs    Neuro/Psych Stroke 4 months ago. Residual right sided weakness, memory issues. CVA, Residual Symptoms  negative psych ROS   GI/Hepatic ,neg GERD  ,,(+)     (-) substance abuse  Chronic nausea and vomiting   Endo/Other  neg diabetes    Renal/GU ARFRenal diseaseAKI improving after hydration     Musculoskeletal   Abdominal   Peds  Hematology   Anesthesia Other Findings Past Medical History: No date: Aneurysm of aortic arch (HCC) 2024: Aneurysm of left carotid artery (HCC) No date: Hypertension No date: Stroke [facility])  Reproductive/Obstetrics                             Anesthesia Physical Anesthesia Plan  ASA: 3  Anesthesia Plan: General   Post-op Pain Management: Minimal or no pain anticipated   Induction: Intravenous and Rapid sequence  PONV Risk Score and Plan: 1 and Ondansetron and Dexamethasone  Airway Management Planned: Oral ETT and Video Laryngoscope Planned  Additional Equipment: None  Intra-op Plan:   Post-operative Plan: Extubation in OR  Informed Consent: I have reviewed the patients  History and Physical, chart, labs and discussed the procedure including the risks, benefits and alternatives for the proposed anesthesia with the patient or authorized representative who has indicated his/her understanding and acceptance.     Dental advisory given  Plan Discussed with: CRNA and Surgeon  Anesthesia Plan Comments: (Patient with persistent nausea vomiting, unclear etiology. Abdominal imaging not revealing of any obvious pathology, no evidence of obstruction. Patient says she last vomited this morning (clear liquid), still has nausea.  Discussed risks of anesthesia with patient, including PONV, sore throat, lip/dental/eye damage, aspiration. Rare risks discussed as well, such as cardiorespiratory and neurological sequelae, and allergic reactions. Discussed the role of CRNA in patient's perioperative care. Patient understands. Plan for RSI.)       Anesthesia Quick Evaluation

## 2023-08-31 NOTE — Progress Notes (Addendum)
Progress Note   [name] ZOX:[id number] DOB: Apr 03, 1956 DOA: 08/28/2023     3 DOS: the patient was seen and examined on 08/31/2023   Brief hospital course: HPI on admissio 08/28/2023:  "[name] is a 67 year old female with history of hypertension, hyperlipidemia, GERD, history of CVA, who presents to the emergency department for chief concerns of nausea, vomiting for 2 weeks."  In the ED , vitals stable except initial BP 89/67. Afebrile.  Labs were notable for K 5.3, Cr 2.65 (up from 1.45 previously), BUN 95, anion gap 17, lipase 102, WBC 11.6k (stable from prior).  Covid-19 Negative.  UA negative for infection.   CT abd/pelvis was unremarkable including normal pancreas.  Patient was admitted to hospital and started on IV fluids, IV antiemetics and supportive care.  Blood pressures responded well to IV fluids and stabilized.  Further hospital course and management as outlined below.  Assessment and Plan: * Intractable nausea and vomiting Etiology unclear.  Duration of 2-3 weeks -- unlikely viral or foodborne illness, negative for Covid.  Lipase mildly elevated but normal appearance of pancreas on CT scan. 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 Currently on IV PPI BID. --Supportive care  High anion gap metabolic acidosis 9/7 bicarb down 20 >> 14, gap 19. Suspect due to N/V. Check lactic acid level. Change IV fluids to Bicarb-1/2NS Monitor BMP  Gastritis and gastroduodenitis Seen on EGD 9/7 - see Op Note and GI recommendations.  Mgmt as outlined.  AKI (acute kidney injury) (HCC) Improving with IV fluids. Cr 2.65 >> 2.56 >> 1.60 >>  1.41. Presumed prerenal secondary to poor p.o. intake in setting of intractable nausea and vomiting --Continue IV fluids --Monitor BMP --Renally dose meds & avoid nephrotoxins  Hyperkalemia Resolved, due to AKI. --Monitor BMP  Hypomagnesemia 9/7 - Mg 1.6, replacing with 2 g IV Mg-sulfate.  Monitor Mg & replace PRN  Hypertension Meds held on admission due to soft BP's Not tolerating PO medications. 9/7 - N/V little better --Resume home BP meds - Coreg, ARB/hydrochlorothiazide and clonidine --IV hydralazine or IV labetalol PRN   History of left PCA stroke Continue home statin  Dieulafoy lesion of duodenum Noted on EGD 9/7 - see Op Note and GI's recommendations.  Weakness Fall precautions  Leukocytosis WBC 11.6 on admission - resolved. Likely due to GI illness.  UA negative.  CXR negative.  No other s/sx's of other infection --Follow CBC  Hyperlipemia Continue home statin        Subjective: Pt awake resting in bed this AM. Reports N/V little better this morning.  No abdominal pain or fever/chills.  Seen before EGD today.     Physical Exam: Vitals:   08/31/23 1343 08/31/23 1346 08/31/23 1350 08/31/23 1429  BP:  (!) 171/90 (!) 183/122 (!) 196/116  Pulse: (!) 106 (!) 107 (!) 107 (!) 110  Resp: (!) 25 (!) 21 (!) 23 18  Temp:  98.5 F (36.9 C)  98.5 F (36.9 C)  TempSrc:      SpO2: 97% 97% 97% 99%  Weight:      Height:       General exam: awake, alert, no acute distress HEENT: moist mucus membranes, hearing grossly normal  Respiratory system: CTA, no wheezes, rales or rhonchi, normal respiratory effort. Cardiovascular system: normal S1/S2, RRR, no pedal edema.   Gastrointestinal system: soft, NT, ND, no HSM felt, +bowel sounds. Central nervous system: A&O x 3. no gross focal neurologic deficits, normal speech Extremities: moves all, no edema, normal tone Skin: dry, intact, normal temperature Psychiatry: normal mood, congruent affect, judgement and insight  appear normal    Data Reviewed:  Notable labs -- Bicarb 20 >> 14 (anion gap 19) BUN 25, Cr 1.41 improving LIPASE improved 116 >> 86 Lactic acid normal 0.7 WBC 11.8 from 9.3    Family Communication: Updated patient's niece, Briana Wright by phone 9/6.    Disposition: Status is: Inpatient Remains inpatient appropriate because: remains on IV fluids and IV meds as above until further improvement. Ongoing nausea/vomiting, not tolerating PO intake.   Planned Discharge Destination: Home    Time spent: 38 minutes  Author: Pennie Banter, DO 08/31/2023 4:21 PM  For on call review [email or web address].

## 2023-08-31 NOTE — Op Note (Signed)
[facility]astroenter[facility] Patient Name: Briana Wright Procedure Date: 08/31/2023 11:59 AM MRN: [id number] Account #: [id number] Date of Birth: Nov 21, 1956 Admit Type: Inpatient Age: 67 Room: [facility] ENDO ROOM 4 Gender: Female Note Status: Finalized Instrument Name: Laurette Schimke [id number] Procedure:             Upper GI endoscopy Indications:           Nausea with vomiting Providers:             Toney Reil MD, MD Referring MD:          No Local Md, MD (Referring MD) Medicines:             General Anesthesia Complications:         No immediate complications. Estimated blood loss: None. Procedure:             Pre-Anesthesia Assessment:                        - Prior to the procedure, a History and Physical was                         performed, and patient medications and allergies were                         reviewed. The patient is competent. The risks and                         benefits of the procedure and the sedation options and                         risks were discussed with the patient. All questions                         were answered and informed consent was obtained.                         Patient identification and proposed procedure were                         verified by the physician, the nurse, the                         anesthesiologist, the anesthetist and the technician                         in the pre-procedure area in the procedure room in the                         endoscopy suite. Mental Status Examination: alert and                         oriented. Airway Examination: normal oropharyngeal                         airway and neck mobility. Respiratory Examination:                         clear to auscultation. CV Examination: normal.  Prophylactic Antibiotics: The patient does not require                         prophylactic antibiotics. Prior Anticoagulants: The                         patient has  taken no anticoagulant or antiplatelet                         agents. ASA Grade Assessment: III - A patient with                         severe systemic disease. After reviewing the risks and                         benefits, the patient was deemed in satisfactory                         condition to undergo the procedure. The anesthesia                         plan was to use general anesthesia. Immediately prior                         to administration of medications, the patient was                         re-assessed for adequacy to receive sedatives. The                         heart rate, respiratory rate, oxygen saturations,                         blood pressure, adequacy of pulmonary ventilation, and                         response to care were monitored throughout the                         procedure. The physical status of the patient was                         re-assessed after the procedure.                        After obtaining informed consent, the endoscope was                         passed under direct vision. Throughout the procedure,                         the patient's blood pressure, pulse, and oxygen                         saturations were monitored continuously. The Endoscope                         was introduced through the mouth, and advanced to the  third part of duodenum. The upper GI endoscopy was                         accomplished without difficulty. The patient tolerated                         the procedure well. 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 Area was successfully injected with       4 mL of a 0.1 mg/mL solution of epinephrine for hemostasis. Estimated       blood loss: none. For hemostasis, three hemostatic clips were       successfully placed (MR safe). Clip manufacturer: [employer].       There was no bleeding during, or at the  end, of the procedure.      Diffuse mild inflammation characterized by congestion (edema), erythema       and friability was found in the duodenal bulb.      Multiple dispersed diminutive erosions with no bleeding and no stigmata       of recent bleeding were found at the incisura and in the gastric antrum.       Biopsies were taken with a cold forceps for histology.      Diffuse mildly erythematous mucosa without bleeding was found in the       gastric body. Biopsies were taken with a cold forceps for histology.      The cardia and gastric fundus were normal on retroflexion.      The gastroesophageal junction and examined esophagus were normal. Impression:            - Duodenitis.                        - Erosive gastropathy with no bleeding and no stigmata                         of recent bleeding. Biopsied.                        - Erythematous mucosa in the gastric body. Biopsied.                        - Normal gastroesophageal junction and esophagus. Recommendation:        - Await pathology results.                        - Return patient to hospital ward for ongoing care.                        - Advance diet as tolerated today.                        - Continue present medications.                        - Use Prilosec (omeprazole) 40 mg PO BID for 3 months. Procedure Code(s):     --- Professional ---                        [phone], 59, Esophagogastroduodenoscopy,  flexible,                         transoral; with control of bleeding, any method                        43239, Esophagogastroduodenoscopy, flexible,                         transoral; with biopsy, single or multiple Diagnosis Code(s):     --- Professional ---                        K29.80, Duodenitis without bleeding                        K31.89, Other diseases of stomach and duodenum                        R11.2, Nausea with vomiting, unspecified CPT copyright 2022 [employer]. All rights  reserved. The codes documented in this report are preliminary and upon coder review may  be revised to meet current compliance requirements. Dr. Libby Maw Toney Reil MD, MD 08/31/2023 1:12:51 PM This report has been signed electronically. Number of Addenda: 0 Note Initiated On: 08/31/2023 11:59 AM Estimated Blood Loss:  Estimated blood loss: none.      [facility]

## 2023-08-31 NOTE — Assessment & Plan Note (Signed)
Seen on EGD 9/7 - see Op Note and GI recommendations.  Mgmt as outlined.

## 2023-09-01 DIAGNOSIS — E876 Hypokalemia: Secondary | ICD-10-CM | POA: Diagnosis not present

## 2023-09-01 DIAGNOSIS — R112 Nausea with vomiting, unspecified: Secondary | ICD-10-CM | POA: Diagnosis not present

## 2023-09-01 LAB — HEPATIC FUNCTION PANEL
ALT: 11 U/L (ref 0–44)
AST: 24 U/L (ref 15–41)
Albumin: 3.7 g/dL (ref 3.5–5.0)
Alkaline Phosphatase: 80 U/L (ref 38–126)
Bilirubin, Direct: 0.2 mg/dL (ref 0.0–0.2)
Indirect Bilirubin: 1 mg/dL — ABNORMAL HIGH (ref 0.3–0.9)
Total Bilirubin: 1.2 mg/dL (ref 0.3–1.2)
Total Protein: 7.6 g/dL (ref 6.5–8.1)

## 2023-09-01 LAB — CBC
HCT: 32.6 % — ABNORMAL LOW (ref 36.0–46.0)
Hemoglobin: 11 g/dL — ABNORMAL LOW (ref 12.0–15.0)
MCH: 27.2 pg (ref 26.0–34.0)
MCHC: 33.7 g/dL (ref 30.0–36.0)
MCV: 80.7 fL (ref 80.0–100.0)
Platelets: 329 10*3/uL (ref 150–400)
RBC: 4.04 MIL/uL (ref 3.87–5.11)
RDW: 15.6 % — ABNORMAL HIGH (ref 11.5–15.5)
WBC: 12.1 10*3/uL — ABNORMAL HIGH (ref 4.0–10.5)
nRBC: 0 % (ref 0.0–0.2)

## 2023-09-01 LAB — MAGNESIUM: Magnesium: 1.5 mg/dL — ABNORMAL LOW (ref 1.7–2.4)

## 2023-09-01 LAB — BASIC METABOLIC PANEL
Anion gap: 15 (ref 5–15)
BUN: 22 mg/dL (ref 8–23)
CO2: 20 mmol/L — ABNORMAL LOW (ref 22–32)
Calcium: 8.7 mg/dL — ABNORMAL LOW (ref 8.9–10.3)
Chloride: 102 mmol/L (ref 98–111)
Creatinine, Ser: 1.28 mg/dL — ABNORMAL HIGH (ref 0.44–1.00)
GFR, Estimated: 46 mL/min — ABNORMAL LOW (ref >60)
Glucose, Bld: 109 mg/dL — ABNORMAL HIGH (ref 70–99)
Potassium: 3 mmol/L — ABNORMAL LOW (ref 3.5–5.1)
Sodium: 137 mmol/L (ref 135–145)

## 2023-09-01 LAB — PHOSPHORUS: Phosphorus: 2.5 mg/dL (ref 2.5–4.6)

## 2023-09-01 MED ORDER — POTASSIUM CHLORIDE CRYS ER 20 MEQ PO TBCR
40.0000 meq | EXTENDED_RELEASE_TABLET | Freq: Once | ORAL | Status: AC
  Administered 2023-09-01: 40 meq via ORAL
  Filled 2023-09-01: qty 2

## 2023-09-01 MED ORDER — MAGNESIUM SULFATE 2 GM/50ML IV SOLN
2.0000 g | Freq: Once | INTRAVENOUS | Status: AC
  Administered 2023-09-01: 2 g via INTRAVENOUS
  Filled 2023-09-01: qty 50

## 2023-09-01 NOTE — Progress Notes (Signed)
Progress Note   [name] QMV:[id number] DOB: 12/23/1956 DOA: 08/28/2023     4 DOS: the patient was seen and examined on 09/01/2023   Brief hospital course: HPI on admissio 08/28/2023:  "Ms. Nikeia Flansburg is a 67 year old female with history of hypertension, hyperlipidemia, GERD, history of CVA, who presents to the emergency department for chief concerns of nausea, vomiting for 2 weeks."  In the ED , vitals stable except initial BP 89/67. Afebrile.  Labs were notable for K 5.3, Cr 2.65 (up from 1.45 previously), BUN 95, anion gap 17, lipase 102, WBC 11.6k (stable from prior).  Covid-19 Negative.  UA negative for infection.   CT abd/pelvis was unremarkable including normal pancreas.  Patient was admitted to hospital and started on IV fluids, IV antiemetics and supportive care.  Blood pressures responded well to IV fluids and stabilized.  Further hospital course and management as outlined below.  Assessment and Plan: * Intractable nausea and vomiting Improving, now intermittent. Gastritis, duodenitis seen on EGD. Duration of 2-3 weeks -- unlikely viral or foodborne illness, negative for Covid.  Lipase mildly elevated but normal appearance of pancreas on CT scan. 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 Currently on IV PPI BID. --Supportive care --Follow pending H pylori stool Ag --Follow EGD biopsy results  High anion gap metabolic acidosis 9/7 bicarb down 20 >> 14, gap 19. 9/8 bicarb improved to 20 Suspect due to N/V. Normal lactic acid level. --Changed IV fluids to Bicarb-1/2NS -- continue today, stop this evening --Monitor BMP  Gastritis and  gastroduodenitis Seen on EGD 9/7 - see Op Note and GI recommendations.  Mgmt as outlined.  AKI (acute kidney injury) (HCC) Improving with IV fluids. Cr 2.65 >> 2.56 >> 1.60 >> 1.41 >> 1.26. Presumed prerenal secondary to poor p.o. intake in setting of intractable nausea and vomiting --Continue IV fluids, stop this evening and monitor for adequate PO hydration --Monitor BMP --Renally dose meds & avoid nephrotoxins  Hyperkalemia Resolved, due to AKI. --Monitor BMP  Hypomagnesemia 9/8 - Mg 1.5 - replacing with 2 g IV  Monitor Mg & replace PRN  Hypertension Meds held on admission due to soft BP's Not tolerating PO medications. 9/7 - N/V little better, PO meds resumed --Continue home BP meds - Coreg, ARB/hydrochlorothiazide and clonidine --IV hydralazine or IV labetalol PRN   History of left PCA stroke Continue home statin  Hypokalemia K 3.0 this AM, due to N/V & ongoing poor PO intake. --Replace with PO, will order IV if pt unable to tolerate --Monitor BMP, Mg levels  Dieulafoy lesion of duodenum Noted on EGD 9/7 - see Op Note and GI's recommendations.  Weakness Fall precautions  Leukocytosis WBC 11.6 on admission - resolved. Likely due to GI illness.  UA negative.  CXR negative.  No other s/sx's of other infection --Follow CBC  Hyperlipemia Continue home statin        Subjective: Pt awake resting in bed this afternoon. She was sleeping soundly on AM rounds, I didn't wake her due to pt up sick last night, fever around midnight, needed IV zofran.  This aftenroon, pt reports feeling better.  No N/V since waking up.  Not eating much because "it's hospital food".  No  BM yet.  Denies other complaints.  Hopes to be well enough to go home soon.    Physical Exam: Vitals:   08/31/23 2344 09/01/23 0100 09/01/23 0300 09/01/23 0843  BP: (!) 199/118 (!) 151/87  (!) 192/112  Pulse:  (!) 116 (!) 110 (!) 105  Resp: 20   14  Temp: (!) 101.3 F (38.5 C) 98.8 F (37.1 C)   98.6 F (37 C)  TempSrc: Oral Oral    SpO2: 100% 100%  100%  Weight:      Height:       General exam: awake, alert, no acute distress HEENT: moist mucus membranes, hearing grossly normal  Respiratory system: on room air, normal respiratory effort. Cardiovascular system: RRR, no pedal edema.   Gastrointestinal system: soft, NT, ND, no HSM felt, +bowel sounds. Central nervous system: A&O x 3. no gross focal neurologic deficits, normal speech Extremities: moves all, no edema, normal tone Skin: dry, intact, normal temperature Psychiatry: normal mood, congruent affect, judgement and insight appear normal    Data Reviewed:  Notable labs -- Bicarb 20 >> 14 (bicarb fluids) >> 20 this AM Cr 1.41 >> 1.28 improving WBC 9.3 >> 11.8 >> 12.1    Family Communication: Updated patient's niece, Britta Mccreedy by phone this afternoon.    Disposition: Status is: Inpatient Remains inpatient appropriate because: remains on IV fluids and IV meds as above until further improvement. Potential d/c in 24-48 hours if further improved, tolerating PO intake and no fevers.   Planned Discharge Destination: Home    Time spent: 38 minutes  Author: Pennie Banter, DO 09/01/2023 3:23 PM  For on call review [email or web address].

## 2023-09-01 NOTE — Plan of Care (Signed)

## 2023-09-01 NOTE — Assessment & Plan Note (Addendum)
K normalized with replacement. Due to N/V and poor PO intake, on thiazide also --Monitor BMP, Mg levels at follow up with PCP within 1 week --Discharge on daily K-Cl supplementation to prevent recurrence

## 2023-09-01 NOTE — Progress Notes (Signed)
Briana Repress, MD [address]  [address]  [place], [place] [address]  Main: [phone]  Fax: [phone] Pager: [phone]   Subjective: No acute events overnight.  Patient reports that her nausea is significantly better.  She underwent EGD yesterday   Objective: Vital signs in last 24 hours: Vitals:   09/01/23 0100 09/01/23 0300 09/01/23 0843 09/01/23 1538  BP: (!) 151/87  (!) 192/112 102/66  Pulse: (!) 116 (!) 110 (!) 105 75  Resp:   14 18  Temp: 98.8 F (37.1 C)  98.6 F (37 C) 98.6 F (37 C)  TempSrc: Oral   Oral  SpO2: 100%  100% 99%  Weight:      Height:       Weight change:   Intake/Output Summary (Last 24 hours) at 09/01/2023 1603 Last data filed at 09/01/2023 1100 Gross per 24 hour  Intake 21.41 ml  Output --  Net 21.41 ml     Exam: Heart: Regular rate and rhythm, S1S2 present, or without murmur or extra heart sounds Lungs: normal and clear to auscultation Abdomen: soft, nontender, normal bowel sounds   Lab Results:    Latest Ref Rng & Units 09/01/2023    3:49 AM 08/31/2023    5:42 AM 08/29/2023    4:04 AM  CBC  WBC 4.0 - 10.5 K/uL 12.1  11.8  9.3   Hemoglobin 12.0 - 15.0 g/dL 53.6  64.4  03.4   Hematocrit 36.0 - 46.0 % 32.6  37.3  34.3   Platelets 150 - 400 K/uL 329  353  307       Latest Ref Rng & Units 09/01/2023    3:49 AM 08/31/2023    5:42 AM 08/30/2023    5:28 AM  CMP  Glucose 70 - 99 mg/dL 742  93  79   BUN 8 - 23 mg/dL 22  25  51   Creatinine 0.44 - 1.00 mg/dL 5.95  6.38  7.56   Sodium 135 - 145 mmol/L 137  140  138   Potassium 3.5 - 5.1 mmol/L 3.0  3.7  3.5   Chloride 98 - 111 mmol/L 102  107  104   CO2 22 - 32 mmol/L 20  14  20    Calcium 8.9 - 10.3 mg/dL 8.7  9.2  9.0   Total Protein 6.5 - 8.1 g/dL 7.6     Total Bilirubin 0.3 - 1.2 mg/dL 1.2     Alkaline Phos 38 - 126 U/L 80     AST 15 - 41 U/L 24     ALT 0 - 44 U/L 11       Micro Results: Recent Results (from the past 240 hour(s))  SARS Coronavirus 2 by RT PCR  (hospital order, performed in [facility] hospital lab) *cepheid single result test* Anterior Nasal Swab     Status: None   Collection Time: 08/28/23  9:05 PM   Specimen: Anterior Nasal Swab  Result Value Ref Range Status   SARS Coronavirus 2 by RT PCR NEGATIVE NEGATIVE Final    Comment: (NOTE) SARS-CoV-2 target nucleic acids are NOT DETECTED.  The SARS-CoV-2 RNA is generally detectable in upper and lower respiratory specimens during the acute phase of infection. The lowest concentration of SARS-CoV-2 viral copies this assay can detect is 250 copies / mL. A negative result does not preclude SARS-CoV-2 infection and should not be used as the sole basis for treatment or other patient management decisions.  A negative result  may occur with improper specimen collection / handling, submission of specimen other than nasopharyngeal swab, presence of viral mutation(s) within the areas targeted by this assay, and inadequate number of viral copies (<250 copies / mL). A negative result must be combined with clinical observations, patient history, and epidemiological information.  Fact Sheet for Patients:   RoadLapTop.co.za  Fact Sheet for Healthcare Providers: http://kim-miller.com/  This test is not yet approved or  cleared by the [place] FDA and has been authorized for detection and/or diagnosis of SARS-CoV-2 by FDA under an Emergency Use Authorization (EUA).  This EUA will remain in effect (meaning this test can be used) for the duration of the COVID-19 declaration under Section 564(b)(1) of the Act, 21 U.S.C. section 360bbb-3(b)(1), unless the authorization is terminated or revoked sooner.  Performed at [facility], [address] [place], [place] [address]    Studies/Results: No results found. Medications: I have reviewed the patient's current medications. Prior to Admission:  Medications Prior to Admission  Medication  Sig Dispense Refill Last Dose   acetaminophen (TYLENOL) 325 MG tablet Take 2 tablets (650 mg total) by mouth every 6 (six) hours as needed for mild pain, fever or headache (or Fever >/= 101).   prn at unknown   atorvastatin (LIPITOR) 80 MG tablet Take 1 tablet by mouth at bedtime.      carvedilol (COREG) 6.25 MG tablet Take 6.25 mg by mouth 2 (two) times daily with a meal.   Past Month   cloNIDine (CATAPRES) 0.1 MG tablet Take 0.1 mg by mouth every 12 (twelve) hours.   Past Month   Multiple Vitamin (MULTIVITAMIN WITH MINERALS) TABS tablet Take 1 tablet by mouth daily. 30 tablet 0 Past Month   olmesartan-hydrochlorothiazide (BENICAR HCT) 40-25 MG tablet Take 1 tablet by mouth daily.   Past Month   pantoprazole (PROTONIX) 40 MG tablet Take 1 tablet (40 mg total) by mouth daily. 30 tablet 1 Past Month   potassium chloride SA (KLOR-CON M) 20 MEQ tablet Take 40 mEq by mouth daily.   Past Month   senna-docusate (SENOKOT-S) 8.6-50 MG tablet Take 1 tablet by mouth at bedtime as needed for mild constipation.   prn at unknown   aspirin 81 MG chewable tablet Chew 81 mg by mouth daily.      mirtazapine (REMERON) 7.5 MG tablet Take 7.5 mg by mouth at bedtime. (Patient not taking: Reported on 08/29/2023)   Not Taking   ondansetron (ZOFRAN) 4 MG tablet Take 1 tablet (4 mg total) by mouth every 6 (six) hours as needed for nausea. (Patient not taking: Reported on 08/29/2023) 20 tablet 0 Not Taking   sucralfate (CARAFATE) 1 g tablet Take 1 tablet (1 g total) by mouth 4 (four) times daily for 15 days. 60 tablet 0    Scheduled:  aspirin  81 mg Oral Daily   atorvastatin  20 mg Oral QHS   carvedilol  6.25 mg Oral BID WC   cloNIDine  0.1 mg Oral Q12H   feeding supplement  1 Container Oral TID BM   heparin  5,000 Units Subcutaneous Q8H   irbesartan  300 mg Oral Daily   And   hydrochlorothiazide  25 mg Oral Daily   multivitamin with minerals  1 tablet Oral Daily   pantoprazole (PROTONIX) IV  40 mg Intravenous Q12H    potassium chloride  40 mEq Oral Once   sucralfate  1 g Oral TID WC & HS   Continuous:  sodium bicarbonate 75 mEq  in sodium chloride 0.45 % 1,075 mL infusion 100 mL/hr at 09/01/23 1248   NGE:[id number] **OR** acetaminophen, hydrALAZINE, labetalol, ondansetron **OR** ondansetron (ZOFRAN) IV, prochlorperazine, senna-docusate Anti-infectives (From admission, onward)    None      Scheduled Meds:  aspirin  81 mg Oral Daily   atorvastatin  20 mg Oral QHS   carvedilol  6.25 mg Oral BID WC   cloNIDine  0.1 mg Oral Q12H   feeding supplement  1 Container Oral TID BM   heparin  5,000 Units Subcutaneous Q8H   irbesartan  300 mg Oral Daily   And   hydrochlorothiazide  25 mg Oral Daily   multivitamin with minerals  1 tablet Oral Daily   pantoprazole (PROTONIX) IV  40 mg Intravenous Q12H   potassium chloride  40 mEq Oral Once   sucralfate  1 g Oral TID WC & HS   Continuous Infusions:  sodium bicarbonate 75 mEq in sodium chloride 0.45 % 1,075 mL infusion 100 mL/hr at 09/01/23 1248   PRN Meds:.acetaminophen **OR** acetaminophen, hydrALAZINE, labetalol, ondansetron **OR** ondansetron (ZOFRAN) IV, prochlorperazine, senna-docusate   Assessment: Principal Problem:   Intractable nausea and vomiting Active Problems:   Hyperkalemia   AKI (acute kidney injury) (HCC)   History of left PCA stroke   Hyperlipemia   Leukocytosis   Weakness   Hypertension   Gastritis and gastroduodenitis   Dieulafoy lesion of duodenum   High anion gap metabolic acidosis   Hypomagnesemia   Hypokalemia  Briana Wright is a 67 y.o. female with history of hypertension, stroke, hyperlipidemia presented with approximately 3 months history of intractable nausea and vomiting resulting in dehydration, AKI.  I spoke to patient's niece yesterday who provided more history    Plan: Intractable nausea and vomiting: Symptoms are improving post EGD S/p EGD on 9/7 which revealed Dieulafoy's lesion in second portion of  duodenum adjacent to major papilla s/p epi and clips.  Also, gastric erosions and gastric erythema, biopsies performed No evidence of cholelithiasis, cholecystitis or acute pancreatitis, normal LFTs CT abdomen and pelvis with contrast did not reveal any acute intra-abdominal pathology Carafate suspension 3 times daily as needed Antiemetics, alternating Zofran and Phenergan at scheduled interval of 6 to 8 hours Check H. pylori stool antigen A.m. cortisol levels were normal Continue Protonix 40 mg p.o. twice daily before meals Diet as tolerated, continue adequate hydration Patient to follow-up with [facility] upon discharge   Thank you for involving me in the care of this patient.  GI will sign off at this time, please call us back with questions or concerns   LOS: 4 days   Briana Wright 09/01/2023, 4:03 PM

## 2023-09-02 ENCOUNTER — Encounter: Payer: Self-pay | Admitting: Gastroenterology

## 2023-09-02 DIAGNOSIS — R112 Nausea with vomiting, unspecified: Secondary | ICD-10-CM | POA: Diagnosis not present

## 2023-09-02 LAB — CBC
HCT: 26.7 % — ABNORMAL LOW (ref 36.0–46.0)
HCT: 26.7 % — ABNORMAL LOW (ref 36.0–46.0)
Hemoglobin: 8.8 g/dL — ABNORMAL LOW (ref 12.0–15.0)
Hemoglobin: 8.8 g/dL — ABNORMAL LOW (ref 12.0–15.0)
MCH: 27.2 pg (ref 26.0–34.0)
MCH: 27.5 pg (ref 26.0–34.0)
MCHC: 33 g/dL (ref 30.0–36.0)
MCHC: 33 g/dL (ref 30.0–36.0)
MCV: 82.7 fL (ref 80.0–100.0)
MCV: 83.4 fL (ref 80.0–100.0)
Platelets: 226 10*3/uL (ref 150–400)
Platelets: 217 10*3/uL (ref 150–400)
RBC: 3.23 MIL/uL — ABNORMAL LOW (ref 3.87–5.11)
RBC: 3.2 MIL/uL — ABNORMAL LOW (ref 3.87–5.11)
RDW: 15.6 % — ABNORMAL HIGH (ref 11.5–15.5)
RDW: 15.6 % — ABNORMAL HIGH (ref 11.5–15.5)
WBC: 11.6 10*3/uL — ABNORMAL HIGH (ref 4.0–10.5)
WBC: 10.2 10*3/uL (ref 4.0–10.5)
nRBC: 0 % (ref 0.0–0.2)
nRBC: 0 % (ref 0.0–0.2)

## 2023-09-02 LAB — BASIC METABOLIC PANEL
Anion gap: 11 (ref 5–15)
Anion gap: 8 (ref 5–15)
BUN: 26 mg/dL — ABNORMAL HIGH (ref 8–23)
BUN: 25 mg/dL — ABNORMAL HIGH (ref 8–23)
CO2: 26 mmol/L (ref 22–32)
CO2: 26 mmol/L (ref 22–32)
Calcium: 8.5 mg/dL — ABNORMAL LOW (ref 8.9–10.3)
Calcium: 8.3 mg/dL — ABNORMAL LOW (ref 8.9–10.3)
Chloride: 100 mmol/L (ref 98–111)
Chloride: 98 mmol/L (ref 98–111)
Creatinine, Ser: 1.56 mg/dL — ABNORMAL HIGH (ref 0.44–1.00)
Creatinine, Ser: 1.48 mg/dL — ABNORMAL HIGH (ref 0.44–1.00)
GFR, Estimated: 36 mL/min — ABNORMAL LOW (ref >60)
GFR, Estimated: 38 mL/min — ABNORMAL LOW (ref >60)
Glucose, Bld: 95 mg/dL (ref 70–99)
Glucose, Bld: 108 mg/dL — ABNORMAL HIGH (ref 70–99)
Potassium: 2.9 mmol/L — ABNORMAL LOW (ref 3.5–5.1)
Potassium: 3.6 mmol/L (ref 3.5–5.1)
Sodium: 137 mmol/L (ref 135–145)
Sodium: 132 mmol/L — ABNORMAL LOW (ref 135–145)

## 2023-09-02 LAB — MAGNESIUM: Magnesium: 2.2 mg/dL (ref 1.7–2.4)

## 2023-09-02 MED ORDER — POTASSIUM CHLORIDE IN NACL 20-0.9 MEQ/L-% IV SOLN
INTRAVENOUS | Status: DC
  Filled 2023-09-02 (×2): qty 1000

## 2023-09-02 MED ORDER — POTASSIUM CHLORIDE CRYS ER 20 MEQ PO TBCR
40.0000 meq | EXTENDED_RELEASE_TABLET | Freq: Once | ORAL | Status: AC
  Administered 2023-09-02: 40 meq via ORAL
  Filled 2023-09-02: qty 2

## 2023-09-02 MED ORDER — POTASSIUM CHLORIDE CRYS ER 20 MEQ PO TBCR: 20.0000 meq | EXTENDED_RELEASE_TABLET | Freq: Two times a day (BID) | ORAL | 0 refills | Status: AC

## 2023-09-02 MED ORDER — MAGNESIUM OXIDE -MG SUPPLEMENT 400 (240 MG) MG PO TABS: 400.0000 mg | ORAL_TABLET | Freq: Every day | ORAL | Status: AC

## 2023-09-02 MED ORDER — POTASSIUM CHLORIDE CRYS ER 20 MEQ PO TBCR
20.0000 meq | EXTENDED_RELEASE_TABLET | Freq: Two times a day (BID) | ORAL | 0 refills | Status: AC
Start: 2023-09-02 — End: 2023-09-09

## 2023-09-02 MED ORDER — POTASSIUM CHLORIDE CRYS ER 20 MEQ PO TBCR: 40.0000 meq | EXTENDED_RELEASE_TABLET | ORAL | Status: DC

## 2023-09-02 MED ORDER — ATORVASTATIN CALCIUM 20 MG PO TABS: 20.0000 mg | ORAL_TABLET | Freq: Every evening | ORAL | 2 refills | Status: AC

## 2023-09-02 MED ORDER — PANTOPRAZOLE SODIUM 40 MG PO TBEC: 40.0000 mg | DELAYED_RELEASE_TABLET | Freq: Every day | ORAL | 2 refills | Status: AC

## 2023-09-02 MED ORDER — SUCRALFATE 1 G PO TABS: 1.0000 g | ORAL_TABLET | Freq: Three times a day (TID) | ORAL | 1 refills | Status: AC

## 2023-09-02 MED ORDER — ONDANSETRON 8 MG PO TBDP: 8.0000 mg | ORAL_TABLET | Freq: Three times a day (TID) | ORAL | 0 refills | Status: AC | PRN

## 2023-09-02 NOTE — Discharge Summary (Addendum)
Physician Discharge Summary   [name] MRN: [id number] DOB: 1956/08/05  Admit date:     08/28/2023  Discharge date: 09/02/2023  Discharge Physician: Pennie Banter   PCP: Corky Downs, MD   Recommendations at discharge:   Follow up with Gastroenterology Follow up with Primary Care Repeat BMP, Mg, CBC within one week Follow up pending EGD biopsies Recommend checking H pylori stool antigen (uncollected during admission)  Discharge Diagnoses: Principal Problem:   Intractable nausea and vomiting Active Problems:   Hyperkalemia   AKI (acute kidney injury) (HCC)   Gastritis and gastroduodenitis   High anion gap metabolic acidosis   History of left PCA stroke   Hypertension   Hypomagnesemia   Hyperlipemia   Leukocytosis   Weakness   Dieulafoy lesion of duodenum   Hypokalemia  Resolved Problems:   * No resolved hospital problems. [facility] Course: HPI on admissio 08/28/2023:  "Briana Wright is a 67 year old female with history of hypertension, hyperlipidemia, GERD, history of CVA, who presents to the emergency department for chief concerns of nausea, vomiting for 2 weeks."  In the ED , vitals stable except initial BP 89/67. Afebrile.  Labs were notable for K 5.3, Cr 2.65 (up from 1.45 previously), BUN 95, anion gap 17, lipase 102, WBC 11.6k (stable from prior).  Covid-19 Negative.  UA negative for infection.   CT abd/pelvis was unremarkable including normal pancreas.  Patient was admitted to hospital and started on IV fluids, IV antiemetics and supportive care.  Blood pressures responded well to IV fluids and stabilized.  Further hospital course and management as outlined below.  09/02/23 --- pt seen this AM, reports feeling much better.  Tolerating meals and PO hydration.  Nausea improved, not vomiting.  No further febrile episodes. Pt is clinically improved, medically stable and requesting discharge home today. GI follow up for biopsy results from  EGD    Assessment and Plan:  Intractable Nausea Vomiting DUE TO GASTRITIS, DUODENITIS  * Intractable nausea and vomiting Improving, now intermittent. Gastritis, duodenitis seen on EGD. Duration of 2-3 weeks -- unlikely viral or foodborne illness, negative for Covid.  Lipase mildly elevated but normal appearance of pancreas on CT scan. 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 Currently on IV PPI BID. --Supportive care --Follow pending H pylori stool Ag --Follow EGD biopsy results  High anion gap metabolic acidosis 9/7 bicarb down 20 >> 14, gap 19. 9/8 bicarb improved to 20 Suspect due to N/V. Normal lactic acid level. --Changed IV fluids to Bicarb-1/2NS -- continue today, stop this evening --Monitor BMP  Gastritis and gastroduodenitis Seen on EGD 9/7 - see Op Note and GI recommendations.  Mgmt as outlined.  AKI (acute kidney injury) (HCC) Improving with IV fluids. Cr 2.65 >> 2.56 >> 1.60 >> 1.41 >> 1.26. Presumed prerenal secondary to poor p.o. intake in setting of intractable nausea and vomiting --Continue IV fluids, stop this evening and monitor for adequate PO hydration --Monitor BMP --Renally dose meds & avoid nephrotoxins  Hyperkalemia Resolved, due to AKI. --Monitor BMP  Hypomagnesemia 9/8 - Mg 1.5 - replacing with 2 g IV  Monitor Mg & replace PRN  Hypertension Meds held on admission due to soft BP's Not tolerating  PO medications. 9/7 - N/V little better, PO meds resumed --Continue home BP meds - Coreg, ARB/hydrochlorothiazide and clonidine --IV hydralazine or IV labetalol PRN   History of left PCA stroke Continue home statin  Hypokalemia K  3.0 this AM, due to N/V & ongoing poor PO intake. --Replace with PO, will order IV if pt unable to tolerate --Monitor BMP, Mg levels  Dieulafoy lesion of duodenum Noted on EGD 9/7 - see Op Note and GI's recommendations.  Weakness Fall precautions  Leukocytosis WBC 11.6 on admission - resolved. Likely due to GI illness.  UA negative.  CXR negative.  No other s/sx's of other infection --Follow CBC  Hyperlipemia Continue home statin         Consultants: GI Procedures performed: EGD Disposition: Home Diet recommendation:  Discharge Diet Orders (From admission, onward)     Start     Ordered   09/02/23 0000  Diet - low sodium heart healthy        09/02/23 1556            DISCHARGE MEDICATION: Allergies as of 09/02/2023   No Known Allergies      Medication List     STOP taking these medications    mirtazapine 7.5 MG tablet Commonly known as: REMERON   ondansetron 4 MG tablet Commonly known as: ZOFRAN       TAKE these medications    acetaminophen 325 MG tablet Commonly known as: TYLENOL Take 2 tablets (650 mg total) by mouth every 6 (six) hours as needed for mild pain, fever or headache (or Fever >/= 101).   aspirin 81 MG chewable tablet Chew 81 mg by mouth daily.   atorvastatin 20 MG tablet Commonly known as: LIPITOR Take 1 tablet (20 mg total) by mouth at bedtime. What changed:  medication strength Another medication with the same name was removed. Continue taking this medication, and follow the directions you see here.   carvedilol 6.25 MG tablet Commonly known as: COREG Take 6.25 mg by mouth 2 (two) times daily with a meal.   cloNIDine 0.1 MG tablet Commonly known as: CATAPRES Take 0.1 mg by mouth every 12 (twelve) hours.   magnesium oxide 400 (240 Mg) MG tablet Commonly known as: MAG-OX Take 1 tablet (400 mg total) by mouth daily for 7 days.   multivitamin with minerals Tabs tablet Take 1 tablet by mouth daily.    olmesartan-hydrochlorothiazide 40-25 MG tablet Commonly known as: BENICAR HCT Take 1 tablet by mouth daily.   ondansetron 8 MG disintegrating tablet Commonly known as: ZOFRAN-ODT Take 1 tablet (8 mg total) by mouth every 8 (eight) hours as needed.   pantoprazole 40 MG tablet Commonly known as: Protonix Take 1 tablet (40 mg total) by mouth daily.   potassium chloride SA 20 MEQ tablet Commonly known as: KLOR-CON M Take 1 tablet (20 mEq total) by mouth 2 (two) times daily for 7 days. What changed:  how much to take when to take this   potassium chloride SA 20 MEQ tablet Commonly known as: KLOR-CON M Take 1 tablet (20 mEq total) by mouth 2 (two) times daily for 7 days. What changed: You were already taking a medication with the same name, and this prescription was added. Make sure you understand how and when to take each.   senna-docusate 8.6-50 MG tablet Commonly known as: Senokot-S Take 1 tablet by mouth at bedtime as needed for mild constipation.   sucralfate 1 g tablet Commonly known as: CARAFATE Take 1  tablet (1 g total) by mouth 4 (four) times daily -  with meals and at bedtime. What changed: when to take this        Follow-up Information     Corky Downs, MD. Schedule an appointment as soon as possible for a visit.   Specialties: Internal Medicine, Cardiology Why: Hospital follow up within 1 week to repeat labs Contact information: [address] [place] [address] [phone]         Toney Reil, MD. Call.   Specialty: Gastroenterology Why: Follow up Contact information: [address] [place] [address] [phone]                Discharge Exam: Ceasar Mons Weights   08/28/23 1250  Weight: 60.2 kg   General exam: awake, alert, no acute distress HEENT: atraumatic, clear conjunctiva, anicteric sclera, moist mucus membranes, hearing grossly normal  Respiratory system: CTAB, no wheezes, rales or rhonchi, normal respiratory  effort. Cardiovascular system: normal S1/S2, RRR, no JVD, murmurs, rubs, gallops,  no pedal edema.   Gastrointestinal system: soft, NT, ND, no HSM felt, +bowel sounds. Central nervous system: A&O x3. no gross focal neurologic deficits, normal speech Extremities: moves all , no edema, normal tone Skin: dry, intact, normal temperature, normal color, No rashes, lesions or ulcers Psychiatry: normal mood, congruent affect, judgement and insight appear normal   Condition at discharge: stable  The results of significant diagnostics from this hospitalization (including imaging, microbiology, ancillary and laboratory) are listed below for reference.   Imaging Studies: DG Chest Port 1 View  Result Date: 08/28/2023 CLINICAL DATA:  Intractable nausea, vomiting, weakness EXAM: PORTABLE CHEST 1 VIEW COMPARISON:  05/09/2023 FINDINGS: Heart and mediastinal contours within normal limits. Aortic atherosclerosis. Linear scarring in the lung bases. No acute confluent airspace opacities or effusions. No acute bony abnormality. IMPRESSION: Bibasilar scarring. No active disease. Electronically Signed   By: Charlett Nose M.D.   On: 08/28/2023 20:14   CT ABDOMEN PELVIS W CONTRAST  Result Date: 08/20/2023 CLINICAL DATA:  Left lower quadrant abdominal pain, vomiting, chills EXAM: CT ABDOMEN AND PELVIS WITH CONTRAST TECHNIQUE: Multidetector CT imaging of the abdomen and pelvis was performed using the standard protocol following bolus administration of intravenous contrast. RADIATION DOSE REDUCTION: This exam was performed according to the departmental dose-optimization program which includes automated exposure control, adjustment of the mA and/or kV according to patient size and/or use of iterative reconstruction technique. CONTRAST:  80mL OMNIPAQUE IOHEXOL 300 MG/ML  SOLN COMPARISON:  05/29/2023 FINDINGS: Lower chest: Mild bilateral lower lobe atelectasis. Hepatobiliary: Small subcentimeter hepatic cysts. Gallbladder is  unremarkable. No intrahepatic or extrahepatic duct dilatation. Pancreas: Within normal limits. Spleen: Within normal limits. Adrenals/Urinary Tract: Adrenal glands are within normal limits. Subcentimeter bilateral renal cysts, too small to characterize, almost certainly benign. No follow-up is recommended. No hydronephrosis. Bladder is within normal limits. Stomach/Bowel: Stomach is within normal limits. No evidence of bowel obstruction. Appendix is not discretely visualized. No colonic wall thickening or inflammatory changes. Vascular/Lymphatic: No evidence of abdominal aortic aneurysm. Atherosclerotic calcifications of the abdominal aorta and branch vessels, although vessels remain patent. No suspicious abdominopelvic lymphadenopathy. Reproductive: Status post hysterectomy. No adnexal masses. Other: No abdominopelvic ascites. Musculoskeletal: Visualized osseous structures are within normal limits. IMPRESSION: Negative CT abdomen/pelvis. No CT findings to account for the patient's left lower quadrant abdominal pain. Electronically Signed   By: Charline Bills M.D.   On: 08/20/2023 15:27    Microbiology: Results for orders placed or performed during the hospital  encounter of 08/28/23  SARS Coronavirus 2 by RT PCR (hospital order, performed in [facility] hospital lab) *cepheid single result test* Anterior Nasal Swab     Status: None   Collection Time: 08/28/23  9:05 PM   Specimen: Anterior Nasal Swab  Result Value Ref Range Status   SARS Coronavirus 2 by RT PCR NEGATIVE NEGATIVE Final    Comment: (NOTE) SARS-CoV-2 target nucleic acids are NOT DETECTED.  The SARS-CoV-2 RNA is generally detectable in upper and lower respiratory specimens during the acute phase of infection. The lowest concentration of SARS-CoV-2 viral copies this assay can detect is 250 copies / mL. A negative result does not preclude SARS-CoV-2 infection and should not be used as the sole basis for treatment or other patient  management decisions.  A negative result may occur with improper specimen collection / handling, submission of specimen other than nasopharyngeal swab, presence of viral mutation(s) within the areas targeted by this assay, and inadequate number of viral copies (<250 copies / mL). A negative result must be combined with clinical observations, patient history, and epidemiological information.  Fact Sheet for Patients:   RoadLapTop.co.za  Fact Sheet for Healthcare Providers: http://kim-miller.com/  This test is not yet approved or  cleared by the [place] FDA and has been authorized for detection and/or diagnosis of SARS-CoV-2 by FDA under an Emergency Use Authorization (EUA).  This EUA will remain in effect (meaning this test can be used) for the duration of the COVID-19 declaration under Section 564(b)(1) of the Act, 21 U.S.C. section 360bbb-3(b)(1), unless the authorization is terminated or revoked sooner.  Performed at [facility], [address] [place], [place] [address]     Labs: CBC: Recent Labs  Lab 08/28/23 1505 08/29/23 0404 08/31/23 0542 09/01/23 0349 09/02/23 0457 09/02/23 1526  WBC 11.6* 9.3 11.8* 12.1* 11.6* 10.2  NEUTROABS 8.5*  --   --   --   --   --   HGB 12.8 11.2* 12.0 11.0* 8.8* 8.8*  HCT 40.5 34.3* 37.3 32.6* 26.7* 26.7*  MCV 84.2 82.5 84.6 80.7 82.7 83.4  PLT 321 307 353 329 226 217   Basic Metabolic Panel: Recent Labs  Lab 08/30/23 0528 08/31/23 0542 09/01/23 0349 09/02/23 0457 09/02/23 1526  NA 138 140 137 137 132*  K 3.5 3.7 3.0* 2.9* 3.6  CL 104 107 102 100 98  CO2 20* 14* 20* 26 26  GLUCOSE 79 93 109* 95 108*  BUN 51* 25* 22 26* 25*  CREATININE 1.60* 1.41* 1.28* 1.56* 1.48*  CALCIUM 9.0 9.2 8.7* 8.5* 8.3*  MG 1.9 1.6* 1.5* 2.2  --   PHOS  --   --  2.5  --   --    Liver Function Tests: Recent Labs  Lab 08/28/23 1551 09/01/23 0349  AST 21 24  ALT 11 11  ALKPHOS 95  80  BILITOT 0.9 1.2  PROT 8.8* 7.6  ALBUMIN 4.4 3.7   CBG: No results for input(s): "GLUCAP" in the last 168 hours.  Discharge time spent: less than 30 minutes.  Signed: Pennie Banter, DO [facility] 09/02/2023

## 2023-09-02 NOTE — Progress Notes (Signed)
Mobility Specialist - Progress Note   09/02/23 0920  Mobility  Activity Ambulated with assistance in hallway;Stood at bedside;Dangled on edge of bed  Level of Assistance Standby assist, set-up cues, supervision of patient - no hands on  Assistive Device Front wheel walker  Distance Ambulated (ft) 200 ft  Activity Response Tolerated well  Mobility Referral Yes  $Mobility charge 1 Mobility  Mobility Specialist Start Time (ACUTE ONLY) 0849  Mobility Specialist Stop Time (ACUTE ONLY) 0904  Mobility Specialist Time Calculation (min) (ACUTE ONLY) 15 min   Pt supine in bed on RA upon arrival. Pt completes bed mobility, dons socks, STS, and ambulates in hallway Supervision. Pt returns to bed with needs in reach.   Terrilyn Saver  Mobility Specialist  09/02/23 9:22 AM

## 2023-09-02 NOTE — Care Management Important Message (Signed)
Important Message  Patient Details  Name: Briana Wright MRN: [id number] Date of Birth: [birth year]   Medicare Important Message Given:  Yes  Briana Wright was in the process of signing the Important Message from Medicare when I went to visit with her. I will send to the Health Information Management to be added to her medical record.  I thanked her for time and let her know I was glad she was feeling better from last week.    Briana Wright 09/02/2023, 1:05 PM

## 2023-09-06 ENCOUNTER — Telehealth: Payer: Self-pay

## 2023-09-06 NOTE — Telephone Encounter (Signed)
Already has a hospital follow up schedule for 10/30/2023 with you

## 2023-09-06 NOTE — Telephone Encounter (Signed)
-----   Message from [facility] sent at 09/06/2023 10:36 AM EDT ----- Please arrange clinic visit for hospital follow up with me or Dr Tobi Bastos or Celso Amy  RV

## 2023-09-10 ENCOUNTER — Other Ambulatory Visit: Payer: Self-pay | Admitting: Internal Medicine

## 2023-10-30 ENCOUNTER — Ambulatory Visit: Payer: Medicare Other | Admitting: Gastroenterology
